# Patient Record
Sex: Female | Born: 1960 | Race: Black or African American | Marital: Single
Health system: Southern US, Community
[De-identification: ages and names within clinical notes are randomized; demographics above are authoritative.]

## PROBLEM LIST (undated history)

## (undated) DIAGNOSIS — Z8619 Personal history of other infectious and parasitic diseases: Secondary | ICD-10-CM

## (undated) DIAGNOSIS — G8929 Other chronic pain: Secondary | ICD-10-CM

## (undated) DIAGNOSIS — M549 Dorsalgia, unspecified: Secondary | ICD-10-CM

## (undated) DIAGNOSIS — N200 Calculus of kidney: Secondary | ICD-10-CM

## (undated) DIAGNOSIS — R350 Frequency of micturition: Secondary | ICD-10-CM

## (undated) DIAGNOSIS — R3915 Urgency of urination: Secondary | ICD-10-CM

## (undated) DIAGNOSIS — I10 Essential (primary) hypertension: Secondary | ICD-10-CM

## (undated) DIAGNOSIS — N201 Calculus of ureter: Secondary | ICD-10-CM

## (undated) DIAGNOSIS — Z87442 Personal history of urinary calculi: Secondary | ICD-10-CM

## (undated) HISTORY — PX: SHOULDER ARTHROSCOPY W/ ROTATOR CUFF REPAIR: SHX2400

---

## 2005-08-25 HISTORY — PX: URETEROLITHOTOMY: SHX71

## 2015-07-11 ENCOUNTER — Emergency Department (HOSPITAL_COMMUNITY): Payer: Self-pay | Admitting: Anesthesiology

## 2015-07-11 ENCOUNTER — Encounter (HOSPITAL_BASED_OUTPATIENT_CLINIC_OR_DEPARTMENT_OTHER): Payer: Self-pay

## 2015-07-11 ENCOUNTER — Emergency Department (HOSPITAL_BASED_OUTPATIENT_CLINIC_OR_DEPARTMENT_OTHER): Payer: Self-pay

## 2015-07-11 ENCOUNTER — Inpatient Hospital Stay (HOSPITAL_BASED_OUTPATIENT_CLINIC_OR_DEPARTMENT_OTHER)
Admission: EM | Admit: 2015-07-11 | Discharge: 2015-07-15 | DRG: 872 | Disposition: A | Discharge status: Home / Self Care | Payer: Self-pay | Attending: Urology | Admitting: Urology

## 2015-07-11 ENCOUNTER — Encounter (HOSPITAL_COMMUNITY)
Admission: EM | Disposition: A | Discharge status: Home / Self Care | Payer: Self-pay | Source: Home / Self Care | Attending: Urology

## 2015-07-11 DIAGNOSIS — B964 Proteus (mirabilis) (morganii) as the cause of diseases classified elsewhere: Secondary | ICD-10-CM | POA: Diagnosis present

## 2015-07-11 DIAGNOSIS — R109 Unspecified abdominal pain: Secondary | ICD-10-CM

## 2015-07-11 DIAGNOSIS — A599 Trichomoniasis, unspecified: Secondary | ICD-10-CM | POA: Diagnosis present

## 2015-07-11 DIAGNOSIS — Z87891 Personal history of nicotine dependence: Secondary | ICD-10-CM

## 2015-07-11 DIAGNOSIS — N201 Calculus of ureter: Secondary | ICD-10-CM

## 2015-07-11 DIAGNOSIS — A419 Sepsis, unspecified organism: Principal | ICD-10-CM | POA: Diagnosis present

## 2015-07-11 DIAGNOSIS — R05 Cough: Secondary | ICD-10-CM | POA: Diagnosis not present

## 2015-07-11 DIAGNOSIS — D72829 Elevated white blood cell count, unspecified: Secondary | ICD-10-CM | POA: Diagnosis present

## 2015-07-11 DIAGNOSIS — Z87442 Personal history of urinary calculi: Secondary | ICD-10-CM

## 2015-07-11 DIAGNOSIS — N132 Hydronephrosis with renal and ureteral calculous obstruction: Secondary | ICD-10-CM | POA: Diagnosis present

## 2015-07-11 DIAGNOSIS — N136 Pyonephrosis: Secondary | ICD-10-CM | POA: Diagnosis present

## 2015-07-11 DIAGNOSIS — N390 Urinary tract infection, site not specified: Secondary | ICD-10-CM | POA: Diagnosis present

## 2015-07-11 DIAGNOSIS — R509 Fever, unspecified: Secondary | ICD-10-CM

## 2015-07-11 HISTORY — PX: CYSTOSCOPY WITH STENT PLACEMENT: SHX5790

## 2015-07-11 LAB — URINALYSIS, ROUTINE W REFLEX MICROSCOPIC
Glucose, UA: NEGATIVE mg/dL
NITRITE: NEGATIVE
Specific Gravity, Urine: 1.035 — ABNORMAL HIGH (ref 1.005–1.030)
pH: 6 (ref 5.0–8.0)

## 2015-07-11 LAB — CBC
HEMATOCRIT: 42.5 % (ref 36.0–46.0)
Hemoglobin: 14.6 g/dL (ref 12.0–15.0)
MCH: 32.4 pg (ref 26.0–34.0)
MCHC: 34.4 g/dL (ref 30.0–36.0)
MCV: 94.4 fL (ref 78.0–100.0)
Platelets: 263 10*3/uL (ref 150–400)
RBC: 4.5 MIL/uL (ref 3.87–5.11)
RDW: 12.4 % (ref 11.5–15.5)
WBC: 17.4 10*3/uL — ABNORMAL HIGH (ref 4.0–10.5)

## 2015-07-11 LAB — COMPREHENSIVE METABOLIC PANEL
ALT: 15 U/L (ref 14–54)
ANION GAP: 13 (ref 5–15)
AST: 28 U/L (ref 15–41)
Albumin: 3.7 g/dL (ref 3.5–5.0)
Alkaline Phosphatase: 86 U/L (ref 38–126)
BILIRUBIN TOTAL: 0.5 mg/dL (ref 0.3–1.2)
BUN: 12 mg/dL (ref 6–20)
CO2: 27 mmol/L (ref 22–32)
Calcium: 8.9 mg/dL (ref 8.9–10.3)
Chloride: 96 mmol/L — ABNORMAL LOW (ref 101–111)
Creatinine, Ser: 0.77 mg/dL (ref 0.44–1.00)
Glucose, Bld: 142 mg/dL — ABNORMAL HIGH (ref 65–99)
POTASSIUM: 3.6 mmol/L (ref 3.5–5.1)
Sodium: 136 mmol/L (ref 135–145)
TOTAL PROTEIN: 7.7 g/dL (ref 6.5–8.1)

## 2015-07-11 LAB — URINE MICROSCOPIC-ADD ON

## 2015-07-11 LAB — I-STAT CG4 LACTIC ACID, ED
Lactic Acid, Venous: 0.45 mmol/L — ABNORMAL LOW (ref 0.5–2.0)
Lactic Acid, Venous: 1.16 mmol/L (ref 0.5–2.0)

## 2015-07-11 SURGERY — CYSTOSCOPY, WITH STENT INSERTION
Anesthesia: General | Laterality: Left

## 2015-07-11 MED ORDER — FENTANYL CITRATE (PF) 100 MCG/2ML IJ SOLN
INTRAMUSCULAR | Status: DC | PRN
  Administered 2015-07-11 (×2): 50 ug via INTRAVENOUS

## 2015-07-11 MED ORDER — OXYCODONE HCL 5 MG PO TABS
5.0000 mg | ORAL_TABLET | ORAL | Status: DC | PRN
  Administered 2015-07-12 – 2015-07-14 (×13): 5 mg via ORAL
  Filled 2015-07-11 (×13): qty 1

## 2015-07-11 MED ORDER — ZOLPIDEM TARTRATE 5 MG PO TABS: 5.0000 mg | ORAL_TABLET | Freq: Every evening | ORAL | Status: DC | PRN

## 2015-07-11 MED ORDER — DOCUSATE SODIUM 100 MG PO CAPS
100.0000 mg | ORAL_CAPSULE | Freq: Two times a day (BID) | ORAL | Status: DC
  Administered 2015-07-12 – 2015-07-15 (×8): 100 mg via ORAL
  Filled 2015-07-11 (×8): qty 1

## 2015-07-11 MED ORDER — OXYBUTYNIN CHLORIDE 5 MG PO TABS
5.0000 mg | ORAL_TABLET | Freq: Three times a day (TID) | ORAL | Status: DC | PRN
  Administered 2015-07-12 – 2015-07-15 (×3): 5 mg via ORAL
  Filled 2015-07-11 (×3): qty 1

## 2015-07-11 MED ORDER — DEXTROSE-NACL 5-0.45 % IV SOLN: INTRAVENOUS | Status: DC

## 2015-07-11 MED ORDER — IBUPROFEN 800 MG PO TABS
800.0000 mg | ORAL_TABLET | Freq: Once | ORAL | Status: AC
  Administered 2015-07-11: 800 mg via ORAL
  Filled 2015-07-11: qty 1

## 2015-07-11 MED ORDER — PROPOFOL 10 MG/ML IV BOLUS
INTRAVENOUS | Status: AC
  Filled 2015-07-11: qty 20

## 2015-07-11 MED ORDER — ONDANSETRON HCL 4 MG/2ML IJ SOLN: 4.0000 mg | INTRAMUSCULAR | Status: DC | PRN

## 2015-07-11 MED ORDER — SUCCINYLCHOLINE CHLORIDE 20 MG/ML IJ SOLN
INTRAMUSCULAR | Status: DC | PRN
  Administered 2015-07-11: 100 mg via INTRAVENOUS

## 2015-07-11 MED ORDER — DEXTROSE-NACL 5-0.45 % IV SOLN
INTRAVENOUS | Status: DC
  Administered 2015-07-12 – 2015-07-14 (×4): via INTRAVENOUS

## 2015-07-11 MED ORDER — ACETAMINOPHEN 325 MG PO TABS
650.0000 mg | ORAL_TABLET | ORAL | Status: DC | PRN
  Administered 2015-07-12 – 2015-07-13 (×6): 650 mg via ORAL
  Filled 2015-07-11 (×7): qty 2

## 2015-07-11 MED ORDER — DEXTROSE 5 % IV SOLN
1.0000 g | Freq: Once | INTRAVENOUS | Status: AC
  Administered 2015-07-11: 1 g via INTRAVENOUS

## 2015-07-11 MED ORDER — PROPOFOL 10 MG/ML IV BOLUS
INTRAVENOUS | Status: DC | PRN
  Administered 2015-07-11: 150 mg via INTRAVENOUS

## 2015-07-11 MED ORDER — HYDROMORPHONE HCL 1 MG/ML IJ SOLN
INTRAMUSCULAR | Status: AC
  Filled 2015-07-11: qty 1

## 2015-07-11 MED ORDER — ENOXAPARIN SODIUM 40 MG/0.4ML ~~LOC~~ SOLN: 40.0000 mg | SUBCUTANEOUS | Status: DC

## 2015-07-11 MED ORDER — ACETAMINOPHEN 325 MG PO TABS: 650.0000 mg | ORAL_TABLET | ORAL | Status: DC | PRN

## 2015-07-11 MED ORDER — OXYBUTYNIN CHLORIDE 5 MG PO TABS: 5.0000 mg | ORAL_TABLET | Freq: Three times a day (TID) | ORAL | Status: DC | PRN

## 2015-07-11 MED ORDER — CEFTRIAXONE SODIUM 1 G IJ SOLR
INTRAMUSCULAR | Status: AC
  Filled 2015-07-11: qty 10

## 2015-07-11 MED ORDER — DEXTROSE 5 % IV SOLN: 1.0000 g | INTRAVENOUS | Status: DC

## 2015-07-11 MED ORDER — LIDOCAINE HCL (CARDIAC) 20 MG/ML IV SOLN
INTRAVENOUS | Status: AC
  Filled 2015-07-11: qty 5

## 2015-07-11 MED ORDER — HYDROMORPHONE HCL 1 MG/ML IJ SOLN
1.0000 mg | Freq: Once | INTRAMUSCULAR | Status: AC
  Administered 2015-07-11: 1 mg via INTRAVENOUS

## 2015-07-11 MED ORDER — ROCURONIUM BROMIDE 100 MG/10ML IV SOLN
INTRAVENOUS | Status: DC | PRN
  Administered 2015-07-11: 10 mg via INTRAVENOUS

## 2015-07-11 MED ORDER — METOCLOPRAMIDE HCL 5 MG/ML IJ SOLN
INTRAMUSCULAR | Status: AC
  Filled 2015-07-11: qty 2

## 2015-07-11 MED ORDER — OXYCODONE HCL 5 MG PO TABS: 5.0000 mg | ORAL_TABLET | ORAL | Status: DC | PRN

## 2015-07-11 MED ORDER — FENTANYL CITRATE (PF) 100 MCG/2ML IJ SOLN
INTRAMUSCULAR | Status: AC
  Filled 2015-07-11: qty 2

## 2015-07-11 MED ORDER — FENTANYL CITRATE (PF) 100 MCG/2ML IJ SOLN
25.0000 ug | INTRAMUSCULAR | Status: DC | PRN
  Administered 2015-07-11 (×3): 50 ug via INTRAVENOUS

## 2015-07-11 MED ORDER — NEOSTIGMINE METHYLSULFATE 10 MG/10ML IV SOLN
INTRAVENOUS | Status: DC | PRN
Start: 2015-07-11 — End: 2015-07-11
  Administered 2015-07-11: 3 mg via INTRAVENOUS

## 2015-07-11 MED ORDER — IOHEXOL 300 MG/ML  SOLN
INTRAMUSCULAR | Status: DC | PRN
  Administered 2015-07-11: 10 mL

## 2015-07-11 MED ORDER — MORPHINE SULFATE (PF) 4 MG/ML IV SOLN
4.0000 mg | Freq: Once | INTRAVENOUS | Status: AC
  Administered 2015-07-11: 4 mg via INTRAVENOUS
  Filled 2015-07-11: qty 1

## 2015-07-11 MED ORDER — INFLUENZA VAC SPLIT QUAD 0.5 ML IM SUSY
0.5000 mL | PREFILLED_SYRINGE | INTRAMUSCULAR | Status: DC
  Filled 2015-07-11 (×2): qty 0.5

## 2015-07-11 MED ORDER — PROMETHAZINE HCL 25 MG/ML IJ SOLN: 6.2500 mg | INTRAMUSCULAR | Status: DC | PRN

## 2015-07-11 MED ORDER — LACTATED RINGERS IV SOLN
INTRAVENOUS | Status: DC | PRN
  Administered 2015-07-11: 22:00:00 via INTRAVENOUS

## 2015-07-11 MED ORDER — PNEUMOCOCCAL VAC POLYVALENT 25 MCG/0.5ML IJ INJ
0.5000 mL | INJECTION | INTRAMUSCULAR | Status: DC
  Filled 2015-07-11 (×2): qty 0.5

## 2015-07-11 MED ORDER — METOCLOPRAMIDE HCL 5 MG/ML IJ SOLN
INTRAMUSCULAR | Status: DC | PRN
  Administered 2015-07-11: 10 mg via INTRAVENOUS

## 2015-07-11 MED ORDER — ONDANSETRON HCL 4 MG/2ML IJ SOLN
INTRAMUSCULAR | Status: AC
  Filled 2015-07-11: qty 2

## 2015-07-11 MED ORDER — METRONIDAZOLE 500 MG PO TABS
2000.0000 mg | ORAL_TABLET | Freq: Once | ORAL | Status: AC
  Administered 2015-07-11: 2000 mg via ORAL
  Filled 2015-07-11: qty 4

## 2015-07-11 MED ORDER — GLYCOPYRROLATE 0.2 MG/ML IJ SOLN
INTRAMUSCULAR | Status: AC
  Filled 2015-07-11: qty 2

## 2015-07-11 MED ORDER — GLYCOPYRROLATE 0.2 MG/ML IJ SOLN
INTRAMUSCULAR | Status: DC | PRN
  Administered 2015-07-11: .4 mg via INTRAVENOUS

## 2015-07-11 MED ORDER — LIDOCAINE HCL (CARDIAC) 20 MG/ML IV SOLN
INTRAVENOUS | Status: DC | PRN
  Administered 2015-07-11: 50 mg via INTRAVENOUS

## 2015-07-11 MED ORDER — SODIUM CHLORIDE 0.9 % IR SOLN
Status: DC | PRN
  Administered 2015-07-11: 3000 mL via INTRAVESICAL

## 2015-07-11 MED ORDER — ONDANSETRON HCL 4 MG/2ML IJ SOLN
4.0000 mg | Freq: Once | INTRAMUSCULAR | Status: AC
  Administered 2015-07-11: 4 mg via INTRAVENOUS
  Filled 2015-07-11: qty 2

## 2015-07-11 MED ORDER — ONDANSETRON HCL 4 MG/2ML IJ SOLN
INTRAMUSCULAR | Status: DC | PRN
  Administered 2015-07-11: 4 mg via INTRAVENOUS

## 2015-07-11 MED ORDER — DEXTROSE 5 % IV SOLN
1.0000 g | INTRAVENOUS | Status: DC
  Administered 2015-07-12: 1 g via INTRAVENOUS
  Filled 2015-07-11 (×2): qty 10

## 2015-07-11 MED ORDER — ENOXAPARIN SODIUM 40 MG/0.4ML ~~LOC~~ SOLN
40.0000 mg | Freq: Every day | SUBCUTANEOUS | Status: DC
  Administered 2015-07-12 – 2015-07-14 (×4): 40 mg via SUBCUTANEOUS
  Filled 2015-07-11 (×4): qty 0.4

## 2015-07-11 SURGICAL SUPPLY — 15 items
BAG URO CATCHER STRL LF (DRAPE) ×3 IMPLANT
CATH URET 5FR 28IN CONE TIP (BALLOONS) ×2
CATH URET 5FR 70CM CONE TIP (BALLOONS) ×1 IMPLANT
CLOTH BEACON ORANGE TIMEOUT ST (SAFETY) ×3 IMPLANT
GLOVE BIOGEL M 8.0 STRL (GLOVE) ×3 IMPLANT
GOWN STRL REUS W/ TWL XL LVL3 (GOWN DISPOSABLE) ×1 IMPLANT
GOWN STRL REUS W/TWL XL LVL3 (GOWN DISPOSABLE) ×5 IMPLANT
GUIDEWIRE STR DUAL SENSOR (WIRE) ×3 IMPLANT
MANIFOLD NEPTUNE II (INSTRUMENTS) ×3 IMPLANT
PACK CYSTO (CUSTOM PROCEDURE TRAY) ×3 IMPLANT
SHEATH ACCESS URETERAL 38CM (SHEATH) ×3 IMPLANT
STENT URET 6FRX24 CONTOUR (STENTS) ×3 IMPLANT
TUBING CONNECTING 10 (TUBING) ×2 IMPLANT
TUBING CONNECTING 10' (TUBING) ×1
WIRE COONS/BENSON .038X145CM (WIRE) IMPLANT

## 2015-07-11 NOTE — H&P (Signed)
H&P  Chief Complaint: Kidney stone  History of Present Illness: Terri Reyes is a 54 y.o. year old female who is admitted to [facility] for management of a probable infected, very painful left distal ureteral stone. The patient has been having intermittent left flank pain for 2-3 weeks. Over the past 3-4 days, this is been getting severe. It is now associated with significant pain, nausea, vomiting, fever and chills. In the [place], the patient had a fever 102. She had leukocytosis and a CT scan revealed a 9 mm left UVJ stone. I was contacted for further management. I have recommended that we transfer the patient here and have her undergo urgent stenting.  On evaluation here, the patient has significant pain. She still has nausea but has not had recent vomiting.  Past Medical History  Diagnosis Date  . Kidney stones     Past Surgical History  Procedure Laterality Date  . Shoulder surgery    . Kidney stone surgery      Home Medications:   (Not in a hospital admission)  Allergies: No Known Allergies  No family history on file.  Social History:  reports that she has been smoking.  She does not have any smokeless tobacco history on file. She reports that she drinks alcohol. She reports that she does not use illicit drugs.  ROS: A complete review of systems was performed.  All systems are negative except for pertinent findings as noted.  Physical Exam:  Vital signs in last 24 hours: Temp:  [98.5 F (36.9 C)-101.8 F (38.8 C)] 98.7 F (37.1 C) (11/16 2037) Pulse Rate:  [78-106] 89 (11/16 2037) Resp:  [16-18] 18 (11/16 2037) BP: (112-137)/(70-96) 122/86 mmHg (11/16 2037) SpO2:  [94 %-98 %] 98 % (11/16 2037) Weight:  [70.308 kg (155 lb)] 70.308 kg (155 lb) (11/16 1437) General:  Alert and oriented, in moderate distress HEENT: Normocephalic, atraumatic Neck: No JVD or lymphadenopathy Cardiovascular: Regular rate and rhythm Lungs: Clear  bilaterally Abdomen: Soft, left lower quadrant and left CVA tenderness. No rebound or guarding. Extremities: No edema Neurologic: Grossly intact  Laboratory Data:  Results for orders placed or performed during the hospital encounter of 07/11/15 (from the past 24 hour(s))  Urinalysis, Routine w reflex microscopic (not at [facility])     Status: Abnormal   Collection Time: 07/11/15  2:30 PM  Result Value Ref Range   Color, Urine AMBER (A) YELLOW   APPearance TURBID (A) CLEAR   Specific Gravity, Urine 1.035 (H) 1.005 - 1.030   pH 6.0 5.0 - 8.0   Glucose, UA NEGATIVE NEGATIVE mg/dL   Hgb urine dipstick LARGE (A) NEGATIVE   Bilirubin Urine SMALL (A) NEGATIVE   Ketones, ur >80 (A) NEGATIVE mg/dL   Protein, ur >161 (A) NEGATIVE mg/dL   Nitrite NEGATIVE NEGATIVE   Leukocytes, UA MODERATE (A) NEGATIVE  Urine microscopic-add on     Status: Abnormal   Collection Time: 07/11/15  2:30 PM  Result Value Ref Range   Squamous Epithelial / LPF 6-30 (A) NONE SEEN   WBC, UA 6-30 0 - 5 WBC/hpf   RBC / HPF 6-30 0 - 5 RBC/hpf   Bacteria, UA FEW (A) NONE SEEN   Urine-Other MUCOUS PRESENT   CBC     Status: Abnormal   Collection Time: 07/11/15  3:40 PM  Result Value Ref Range   WBC 17.4 (H) 4.0 - 10.5 K/uL   RBC 4.50 3.87 - 5.11 MIL/uL   Hemoglobin 14.6  12.0 - 15.0 g/dL   HCT 16.1 09.6 - 04.5 %   MCV 94.4 78.0 - 100.0 fL   MCH 32.4 26.0 - 34.0 pg   MCHC 34.4 30.0 - 36.0 g/dL   RDW 40.9 81.1 - 91.4 %   Platelets 263 150 - 400 K/uL  Comprehensive metabolic panel     Status: Abnormal   Collection Time: 07/11/15  3:40 PM  Result Value Ref Range   Sodium 136 135 - 145 mmol/L   Potassium 3.6 3.5 - 5.1 mmol/L   Chloride 96 (L) 101 - 111 mmol/L   CO2 27 22 - 32 mmol/L   Glucose, Bld 142 (H) 65 - 99 mg/dL   BUN 12 6 - 20 mg/dL   Creatinine, Ser 7.82 0.44 - 1.00 mg/dL   Calcium 8.9 8.9 - 95.6 mg/dL   Total Protein 7.7 6.5 - 8.1 g/dL   Albumin 3.7 3.5 - 5.0 g/dL   AST 28 15 - 41 U/L   ALT 15 14 - 54 U/L    Alkaline Phosphatase 86 38 - 126 U/L   Total Bilirubin 0.5 0.3 - 1.2 mg/dL   GFR calc non Af Amer >60 >60 mL/min   GFR calc Af Amer >60 >60 mL/min   Anion gap 13 5 - 15  I-Stat CG4 Lactic Acid, ED     Status: Abnormal   Collection Time: 07/11/15  5:20 PM  Result Value Ref Range   Lactic Acid, Venous 0.45 (L) 0.5 - 2.0 mmol/L  I-Stat CG4 Lactic Acid, ED     Status: None   Collection Time: 07/11/15  9:00 PM  Result Value Ref Range   Lactic Acid, Venous 1.16 0.5 - 2.0 mmol/L   No results found for this or any previous visit (from the past 240 hour(s)). Creatinine:  Recent Labs  07/11/15 1540  CREATININE 0.77    Radiologic Imaging: Dg Chest 2 View  07/11/2015  CLINICAL DATA:  Cough since Saturday, abdominal pain since last night, history smoking, kidney stones EXAM: CHEST  2 VIEW COMPARISON:  None FINDINGS: Normal heart size, mediastinal contours, and pulmonary vascularity. Mild peribronchial thickening. No pulmonary infiltrate, pleural effusion, or pneumothorax. Bones unremarkable. IMPRESSION: Bronchitic changes without infiltrate. Electronically Signed   By: Ulyses Southward M.D.   On: 07/11/2015 15:00   Ct Renal Stone Study  07/11/2015  CLINICAL DATA:  Acute left flank pain. EXAM: CT ABDOMEN AND PELVIS WITHOUT CONTRAST TECHNIQUE: Multidetector CT imaging of the abdomen and pelvis was performed following the standard protocol without IV contrast. COMPARISON:  None. 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 Visualized lung bases are unremarkable. No gallstones are noted. No focal abnormality is noted in the liver, spleen or pancreas on these unenhanced images. Adrenal glands appear normal. Small nonobstructive calculus is noted in lower pole collecting system of right kidney. Moderate to severe left hydroureteronephrosis is noted with perinephric stranding secondary to 9 x 5 mm calculus at left ureterovesical junction. The appendix appears normal. There  is no evidence of abdominal aortic aneurysm. There is no evidence of bowel obstruction. Uterus and ovaries are unremarkable. No significant adenopathy is noted. IMPRESSION: Small nonobstructive right renal calculus. Moderate to severe left hydroureteronephrosis with perinephric stranding is noted secondary to 9 x 5 mm left ureterovesical junction calculus. Electronically Signed   By: Lupita Raider, M.D.   On: 07/11/2015 16:19   I have reviewed the above labs and CT images. Impression/Assessment:  Left distal ureteral stone, 5  x 9 mm with significant hydronephrosis and probable pyonephrosis  Plan:  Cystoscopy, left retrograde and double-J stent placement. She will be admitted for antibiotic management postoperatively. I discussed the procedure as well as risks and complications with the patient she understands and desires to proceed.  Chelsea AusDAHLSTEDT, Azeez Dunker M 07/11/2015, 9:23 PM  Bertram MillardStephen M. Lillyen Schow MD

## 2015-07-11 NOTE — ED Notes (Signed)
Spoke with Toniann FailWendy in the OR. Patient is ready for surgery and transportation is on their way.

## 2015-07-11 NOTE — ED Provider Notes (Signed)
CSN: [id number]     Arrival date & time 07/11/15  1424 History   First MD Initiated Contact with Patient 07/11/15 1531     Chief Complaint  Patient presents with  . Cough  . Abdominal Pain     (Consider location/radiation/quality/duration/timing/severity/associated sxs/prior Treatment) Patient is a 54 y.o. female presenting with cough and abdominal pain. The history is provided by the patient and medical records.  Cough Abdominal Pain Associated symptoms: cough and dysuria     54 year old female with history of kidney stones, presenting to the ED for cough and abdominal pain. Patient states since Saturday afternoon she has been having a nonproductive cough, fever, and chills. She denies any recent sick contacts. She has been taking some Mucinex which seems to be breaking up her congestion, but she continues coughing. She denies any chest pain or shortness of breath. She states yesterday she began having some left lower abdominal pain with radiation to her low back. She reports urinary frequency and dysuria. She denies any hematuria. States prior kidney stones had similar symptoms, last episode was approximately 10 years ago. States she used to see urologist while living in [place]-- has had stenting, lithotripsy, and other procedures in the past.  Patient with low-grade fever and mild tachycardia on arrival.  Past Medical History  Diagnosis Date  . Kidney stones    Past Surgical History  Procedure Laterality Date  . Shoulder surgery    . Kidney stone surgery     No family history on file. Social History  Substance Use Topics  . Smoking status: Current Every Day Smoker  . Smokeless tobacco: None  . Alcohol Use: Yes     Comment: weekly   OB History    No data available     Review of Systems  Respiratory: Positive for cough.   Gastrointestinal: Positive for abdominal pain.  Genitourinary: Positive for dysuria, frequency and flank pain.  All other systems reviewed and are  negative.     Allergies  Review of patient's allergies indicates no known allergies.  Home Medications   Prior to Admission medications   Not on File   BP 137/96 mmHg  Pulse 106  Temp(Src) 101.8 F (38.8 C) (Oral)  Resp 18  Ht  (1.702 m)  Wt 155 lb (70.308 kg)  BMI 24.27 kg/m2   Physical Exam  Constitutional: She is oriented to person, place, and time. She appears well-developed and well-nourished.  Non-toxic appearance. She does not appear ill. No distress.  HENT:  Head: Normocephalic and atraumatic.  Right Ear: Tympanic membrane and ear canal normal.  Left Ear: Tympanic membrane and ear canal normal.  Nose: Nose normal.  Mouth/Throat: Uvula is midline, oropharynx is clear and moist and mucous membranes are normal. No oropharyngeal exudate, posterior oropharyngeal edema, posterior oropharyngeal erythema or tonsillar abscesses.  Eyes: Conjunctivae and EOM are normal. Pupils are equal, round, and reactive to light.  Neck: Normal range of motion. Neck supple.  Cardiovascular: Normal rate, regular rhythm and normal heart sounds.   Pulmonary/Chest: Effort normal and breath sounds normal. No respiratory distress. She has no wheezes.  Dry cough noted on exam  Abdominal: Soft. Bowel sounds are normal. There is no tenderness. There is CVA tenderness (left, mild). There is no guarding.  Endorses pain and left lower quadrant with radiation to left flank, mild left CVA tenderness  Musculoskeletal: Normal range of motion. She exhibits no edema.  Neurological: She is alert and oriented to person, place, and time.  Skin:  Skin is warm and dry. She is not diaphoretic.  Psychiatric: She has a normal mood and affect.  Nursing note and vitals reviewed.   ED Course  Procedures (including critical care time) Labs Review Labs Reviewed  URINALYSIS, ROUTINE W REFLEX MICROSCOPIC (NOT AT [facility]) - Abnormal; Notable for the following:    Color, Urine AMBER (*)    APPearance TURBID (*)     Specific Gravity, Urine 1.035 (*)    Hgb urine dipstick LARGE (*)    Bilirubin Urine SMALL (*)    Ketones, ur >80 (*)    Protein, ur >300 (*)    Leukocytes, UA MODERATE (*)    All other components within normal limits  URINE MICROSCOPIC-ADD ON - Abnormal; Notable for the following:    Squamous Epithelial / LPF 6-30 (*)    Bacteria, UA FEW (*)    All other components within normal limits  CBC - Abnormal; Notable for the following:    WBC 17.4 (*)    All other components within normal limits  COMPREHENSIVE METABOLIC PANEL    Imaging Review Dg Chest 2 View  07/11/2015  CLINICAL DATA:  Cough since Saturday, abdominal pain since last night, history smoking, kidney stones EXAM: CHEST  2 VIEW COMPARISON:  None FINDINGS: Normal heart size, mediastinal contours, and pulmonary vascularity. Mild peribronchial thickening. No pulmonary infiltrate, pleural effusion, or pneumothorax. Bones unremarkable. IMPRESSION: Bronchitic changes without infiltrate. Electronically Signed   By: Ulyses Southward M.D.   On: 07/11/2015 15:00   Ct Renal Stone Study  07/11/2015  CLINICAL DATA:  Acute left flank pain. EXAM: CT ABDOMEN AND PELVIS WITHOUT CONTRAST TECHNIQUE: Multidetector CT imaging of the abdomen and pelvis was performed following the standard protocol without IV contrast. COMPARISON:  None. 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 Visualized lung bases are unremarkable. No gallstones are noted. No focal abnormality is noted in the liver, spleen or pancreas on these unenhanced images. Adrenal glands appear normal. Small nonobstructive calculus is noted in lower pole collecting system of right kidney. Moderate to severe left hydroureteronephrosis is noted with perinephric stranding secondary to 9 x 5 mm calculus at left ureterovesical junction. The appendix appears normal. There is no evidence of abdominal aortic aneurysm. There is no evidence of bowel obstruction. Uterus and  ovaries are unremarkable. No significant adenopathy is noted. IMPRESSION: Small nonobstructive right renal calculus. Moderate to severe left hydroureteronephrosis with perinephric stranding is noted secondary to 9 x 5 mm left ureterovesical junction calculus. Electronically Signed   By: Lupita Raider, M.D.   On: 07/11/2015 16:19   I have personally reviewed and evaluated these images and lab results as part of my medical decision-making.   EKG Interpretation None      MDM   Final diagnoses:  Left flank pain   54 year old female here with difficulty urinating and left lower abdominal pain and flank pain. Patient with low-grade fever and mild tachycardia on arrival. She is nontoxic in appearance. She does have some left CVA tenderness. UA appears infectious, culture pending. Labwork with significant leukocytosis, lactic acid within normal limits.  CT renal study was obtained revealing moderate to severe left hydroureteronephrosis secondary to 9 x 5 mm calculus. This coupled with patient's infected urine, leukocytosis, and fever raises suspicion for infected stone. Will speak with urology. Dose of Rocephin given. Patient was also given 2 g Flagyl as trichomonas was noted in her urine. She denies current vaginal symptoms or discharge.  6:55 PM Spoke  with Dr. Retta Dionesahlstedt-- [facility], patient likely will need stent.  ER MD Rubin PayorPickering made aware of transfer.  Fever and tachycardia resolved at this time, BP stable.  Garlon HatchetLisa M Seraphim Affinito, PA-C 07/11/15 2015  Lyndal Pulleyaniel Knott, MD 07/12/15 1250

## 2015-07-11 NOTE — ED Notes (Signed)
PA at bedside.

## 2015-07-11 NOTE — ED Notes (Signed)
Pt transported to surgery

## 2015-07-11 NOTE — ED Notes (Signed)
pa at bedside. 

## 2015-07-11 NOTE — Transfer of Care (Signed)
Immediate Anesthesia Transfer of Care Note  Patient: Terri MaroonLisa Gertsch  Procedure(s) Performed: Procedure(s): CYSTOSCOPY WITH STENT PLACEMENT (Left)  Patient Location: PACU  Anesthesia Type:General  Level of Consciousness: awake, alert , oriented and patient cooperative  Airway & Oxygen Therapy: Patient Spontanous Breathing and Patient connected to face mask oxygen  Post-op Assessment: Report given to RN, Post -op Vital signs reviewed and stable and Patient moving all extremities  Post vital signs: Reviewed and stable  Last Vitals:  Filed Vitals:   07/11/15 2100  BP: 119/74  Pulse: 81  Temp:   Resp: 18    Complications: No apparent anesthesia complications

## 2015-07-11 NOTE — ED Notes (Signed)
INFORMED consent was signed by patient, witnessed Terrilyn Saver(Mikita Lesmeister RN) and physician.

## 2015-07-11 NOTE — ED Notes (Signed)
C/o nonprod cough, chills since Saturday-abd pain since yesterday-denies n/v/d

## 2015-07-11 NOTE — Anesthesia Preprocedure Evaluation (Addendum)
Anesthesia Evaluation  Patient identified by MRN, date of birth, ID band Patient awake    Reviewed: Allergy & Precautions, NPO status , Patient's Chart, lab work & pertinent test results  Airway Mallampati: II  TM Distance: >3 FB Neck ROM: Full    Dental no notable dental hx.    Pulmonary Current Smoker,    Pulmonary exam normal breath sounds clear to auscultation       Cardiovascular negative cardio ROS Normal cardiovascular exam Rhythm:Regular Rate:Normal     Neuro/Psych negative neurological ROS  negative psych ROS   GI/Hepatic negative GI ROS, Neg liver ROS,   Endo/Other  negative endocrine ROS  Renal/GU Renal disease  negative genitourinary   Musculoskeletal negative musculoskeletal ROS (+)   Abdominal   Peds negative pediatric ROS (+)  Hematology negative hematology ROS (+)   Anesthesia Other Findings   Reproductive/Obstetrics negative OB ROS                            Anesthesia Physical Anesthesia Plan  ASA: II and emergent  Anesthesia Plan: General   Post-op Pain Management:    Induction: Intravenous  Airway Management Planned: Oral ETT  Additional Equipment:   Intra-op Plan:   Post-operative Plan: Extubation in OR  Informed Consent: I have reviewed the patients History and Physical, chart, labs and discussed the procedure including the risks, benefits and alternatives for the proposed anesthesia with the patient or authorized representative who has indicated his/her understanding and acceptance.   Dental advisory given  Plan Discussed with: CRNA  Anesthesia Plan Comments:         Anesthesia Quick Evaluation

## 2015-07-11 NOTE — Anesthesia Procedure Notes (Signed)
Procedure Name: Intubation Date/Time: 07/11/2015 10:14 PM Performed by: Jarvis NewcomerARMISTEAD, Brelee Renk A Pre-anesthesia Checklist: Patient identified, Emergency Drugs available, Suction available, Patient being monitored and Timeout performed Patient Re-evaluated:Patient Re-evaluated prior to inductionOxygen Delivery Method: Circle system utilized Preoxygenation: Pre-oxygenation with 100% oxygen Intubation Type: IV induction, Cricoid Pressure applied and Rapid sequence Laryngoscope Size: Mac and 4 Grade View: Grade I Tube type: Oral Tube size: 7.5 mm Number of attempts: 1 Airway Equipment and Method: Stylet Placement Confirmation: ETT inserted through vocal cords under direct vision,  positive ETCO2 and breath sounds checked- equal and bilateral Secured at: 20 cm Tube secured with: Tape Dental Injury: Teeth and Oropharynx as per pre-operative assessment  Comments: Intubation by Dr. Council Mechanicenenny

## 2015-07-11 NOTE — Op Note (Signed)
Preoperative diagnosis: Infected, obstructing left distal ureteral stone  Postoperative diagnosis: Same   Procedure: Cystoscopy, left retrograde ureteropyelogram, interpretive fluoroscopy, left double-J stent placement (24 cm x 6 [place]French contour without string)    Surgeon: Bertram MillardStephen M. Katie Moch, M.D.   Anesthesia: Gen.   Complications: None  Specimen(s): None  Drain(s): See above stent  Indications: 54 year old female who [facility] with left flank pain, fever, chills, infected urine and CT evidence of a 5 x 9 mm left distal ureteral stone with hydronephrosis. She presents at this time, having [facility] stent [facility], to be followed by eventual ureteroscopic management of her stone once antibiotic management has been completed.    Technique and findings: The patient was properly identified in the holding area. Her surgical site had been properly marked. She was taken the operating room where general anesthetic was administered. She was placed in the dorsolithotomy position. Genitalia and perineum were prepped and draped. Proper timeout was performed.  A 23 French panendoscope was advanced into her bladder. The bladder was inspected. Circumferential inspection was performed, revealing normal bladder urothelium, no tumors, foreign bodies or trabeculations. Ureteral orifices were normal in configuration and location. The left ureteral orifice was cannulated with a 6 [place] open-ended catheter. Gentle retrograde ureteropyelogram was performed using Omnipaque. This revealed a significant filling defect in the distal ureter, just proximal to the ureteral orifice. There was significant proximal hydroureteronephrosis as well as pyelocaliectasis. No further filling defects were seen, however.  At this point, a sensor-tip guidewire was advanced through the open-ended catheter, up into the left ureter with adequate curl seen in the pelvis  and upper pole. Over top of the guidewire, once the open-ended catheter was removed, a 24 cm x 6 French contour double-J stent was placed. Good proximal and distal curls were seen using fluoroscopic and cystoscopic guidance once he guidewire was removed. The bladder was then drained. The scope was removed and the procedure terminated.   The patient tolerated procedure well. She was awakened and then taken to the PACU in stable condition.

## 2015-07-11 NOTE — ED Notes (Signed)
Received report from [facility]([place]). Patients vital signs are BP: 117/83, HR:85, RR:16, O2 93% RA, Temp: 98.0 oral. Pain 4/10.

## 2015-07-11 NOTE — ED Notes (Signed)
Patient transported to CT 

## 2015-07-12 ENCOUNTER — Encounter (HOSPITAL_COMMUNITY): Payer: Self-pay | Admitting: Urology

## 2015-07-12 LAB — URINE CULTURE

## 2015-07-12 LAB — CBC
HCT: 37.4 % (ref 36.0–46.0)
HEMOGLOBIN: 12.8 g/dL (ref 12.0–15.0)
MCH: 33.2 pg (ref 26.0–34.0)
MCHC: 34.2 g/dL (ref 30.0–36.0)
MCV: 96.9 fL (ref 78.0–100.0)
Platelets: 240 10*3/uL (ref 150–400)
RBC: 3.86 MIL/uL — AB (ref 3.87–5.11)
RDW: 13.1 % (ref 11.5–15.5)
WBC: 14.5 10*3/uL — AB (ref 4.0–10.5)

## 2015-07-12 LAB — CREATININE, SERUM: CREATININE: 0.8 mg/dL (ref 0.44–1.00)

## 2015-07-12 MED ORDER — SODIUM CHLORIDE 0.9 % IJ SOLN: 3.0000 mL | INTRAMUSCULAR | Status: DC | PRN

## 2015-07-12 MED ORDER — SODIUM CHLORIDE 0.9 % IV SOLN: 250.0000 mL | INTRAVENOUS | Status: DC | PRN

## 2015-07-12 MED ORDER — PHENOL 1.4 % MT LIQD
1.0000 | OROMUCOSAL | Status: DC | PRN
  Administered 2015-07-12: 1 via OROMUCOSAL
  Filled 2015-07-12: qty 177

## 2015-07-12 MED ORDER — MENTHOL 3 MG MT LOZG
1.0000 | LOZENGE | OROMUCOSAL | Status: DC | PRN
  Administered 2015-07-12: 3 mg via ORAL
  Filled 2015-07-12: qty 9

## 2015-07-12 MED ORDER — SODIUM CHLORIDE 0.9 % IJ SOLN: 3.0000 mL | Freq: Two times a day (BID) | INTRAMUSCULAR | Status: DC

## 2015-07-12 NOTE — Anesthesia Postprocedure Evaluation (Signed)
  Anesthesia Post-op Note  Patient: Terri Reyes  Procedure(s) Performed: Procedure(s) (LRB): CYSTOSCOPY WITH STENT PLACEMENT LEFT RETROGRADE (Left)  Patient Location: PACU  Anesthesia Type: General  Level of Consciousness: awake and alert   Airway and Oxygen Therapy: Patient Spontanous Breathing  Post-op Pain: mild  Post-op Assessment: Post-op Vital signs reviewed, Patient's Cardiovascular Status Stable, Respiratory Function Stable, Patent Airway and No signs of Nausea or vomiting  Last Vitals:  Filed Vitals:   07/12/15 0433  BP: 119/81  Pulse: 89  Temp: 36.9 C  Resp: 16    Post-op Vital Signs: stable   Complications: No apparent anesthesia complications

## 2015-07-12 NOTE — Progress Notes (Signed)
PHARMACY NOTE -  antibiotic renal dose adjustment   Pharmacy has been assisting with dosing of Ceftriaxone for UTI. Dosage remains stable at Ceftriaxone 1g IV q24h and need for further dosage adjustment appears unlikely at present.    Pharmacy will sign off at this time.  Please reconsult if a change in clinical status warrants re-evaluation of dosage.  Lynann Beaverhristine Raquel Racey PharmD, BCPS Pager [phone] 07/12/2015 7:45 AM

## 2015-07-12 NOTE — Progress Notes (Signed)
1 Day Post-Op Subjective: Patient reports that she's feeling better. She's had some chills, but she is having no pain, and has no stent discomfort  Objective: Vital signs in last 24 hours: Temp:  [98.5 F (36.9 C)-101.8 F (38.8 C)] 98.5 F (36.9 C) (11/17 0433) Pulse Rate:  [78-106] 89 (11/17 0433) Resp:  [15-21] 16 (11/17 0433) BP: (103-137)/(70-96) 119/81 mmHg (11/17 0433) SpO2:  [94 %-100 %] 100 % (11/17 0433) Weight:  [70.308 kg (155 lb)] 70.308 kg (155 lb) (11/16 1437)  Intake/Output from previous day: 11/16 0701 - 11/17 0700 In: 1017.5 [I.V.:1017.5] Out: 300 [Urine:300] Intake/Output this shift: Total I/O In: 1017.5 [I.V.:1017.5] Out: 300 [Urine:300]  Physical Exam:  Constitutional: Vital signs reviewed. WD WN in NAD   Eyes: PERRL, No scleral icterus.   Cardiovascular: RRR Pulmonary/Chest: Normal effort Genitourinary: Extremities: No cyanosis or edema   Lab Results:  Recent Labs  07/11/15 1540 07/12/15 0035  HGB 14.6 12.8  HCT 42.5 37.4   BMET  Recent Labs  07/11/15 1540 07/12/15 0035  NA 136  --   K 3.6  --   CL 96*  --   CO2 27  --   GLUCOSE 142*  --   BUN 12  --   CREATININE 0.77 0.80  CALCIUM 8.9  --    No results for input(s): LABPT, INR in the last 72 hours. No results for input(s): LABURIN in the last 72 hours. No results found for this or any previous visit.  Studies/Results: Dg Chest 2 View  07/11/2015  CLINICAL DATA:  Cough since Saturday, abdominal pain since last night, history smoking, kidney stones EXAM: CHEST  2 VIEW COMPARISON:  None FINDINGS: Normal heart size, mediastinal contours, and pulmonary vascularity. Mild peribronchial thickening. No pulmonary infiltrate, pleural effusion, or pneumothorax. Bones unremarkable. IMPRESSION: Bronchitic changes without infiltrate. Electronically Signed   By: Ulyses SouthwardMark  Boles M.D.   On: 07/11/2015 15:00   Ct Renal Stone Study  07/11/2015  CLINICAL DATA:  Acute left flank pain. EXAM: CT ABDOMEN  AND PELVIS WITHOUT CONTRAST TECHNIQUE: Multidetector CT imaging of the abdomen and pelvis was performed following the standard protocol without IV contrast. COMPARISON:  None. 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 Visualized lung bases are unremarkable. No gallstones are noted. No focal abnormality is noted in the liver, spleen or pancreas on these unenhanced images. Adrenal glands appear normal. Small nonobstructive calculus is noted in lower pole collecting system of right kidney. Moderate to severe left hydroureteronephrosis is noted with perinephric stranding secondary to 9 x 5 mm calculus at left ureterovesical junction. The appendix appears normal. There is no evidence of abdominal aortic aneurysm. There is no evidence of bowel obstruction. Uterus and ovaries are unremarkable. No significant adenopathy is noted. IMPRESSION: Small nonobstructive right renal calculus. Moderate to severe left hydroureteronephrosis with perinephric stranding is noted secondary to 9 x 5 mm left ureterovesical junction calculus. Electronically Signed   By: Lupita RaiderJames  Green Jr, M.D.   On: 07/11/2015 16:19    Assessment/Plan:   Postoperative day #1 from placement of left double-J stent for probable pyonephrosis. She seems to be doing well. I will saline lock her IV. I like to leave her in at least 24 more hours for antibiotic management.   LOS: 1 day   Marcine MatarDAHLSTEDT, Dorthia Tout M 07/12/2015, 7:00 AM

## 2015-07-13 MED ORDER — SULFAMETHOXAZOLE-TRIMETHOPRIM 800-160 MG PO TABS
1.0000 | ORAL_TABLET | Freq: Two times a day (BID) | ORAL | Status: DC
Start: 2015-07-13 — End: 2015-07-15

## 2015-07-13 MED ORDER — OXYCODONE HCL 5 MG PO TABS: 5.0000 mg | ORAL_TABLET | ORAL | Status: DC | PRN

## 2015-07-13 MED ORDER — PIPERACILLIN-TAZOBACTAM 3.375 G IVPB
3.3750 g | Freq: Three times a day (TID) | INTRAVENOUS | Status: DC
  Administered 2015-07-13 – 2015-07-15 (×7): 3.375 g via INTRAVENOUS
  Filled 2015-07-13 (×7): qty 50

## 2015-07-13 MED ORDER — OXYBUTYNIN CHLORIDE 5 MG PO TABS: 5.0000 mg | ORAL_TABLET | Freq: Three times a day (TID) | ORAL | Status: DC | PRN

## 2015-07-13 NOTE — Progress Notes (Signed)
ANTIBIOTIC CONSULT NOTE - INITIAL  Pharmacy Consult for Zosyn Indication: Bacteremia  No Known Allergies  Patient Measurements: Height: 5\' 7"  (170.2 cm) Weight: 155 lb (70.308 kg) IBW/kg (Calculated) : 61.6  Vital Signs: Temp: 98.3 F (36.8 C) (11/18 0452) Temp Source: Oral (11/18 0452) BP: 131/79 mmHg (11/18 0452) Pulse Rate: 98 (11/18 0452) Intake/Output from previous day: 11/17 0701 - 11/18 0700 In: 750 [P.O.:700; IV Piggyback:50] Out: 275 [Urine:275]  Labs:  Recent Labs  07/11/15 1540 07/12/15 0035  WBC 17.4* 14.5*  HGB 14.6 12.8  PLT 263 240  CREATININE 0.77 0.80   Estimated Creatinine Clearance: 77.3 mL/min (by C-G formula based on Cr of 0.8). No results for input(s): VANCOTROUGH, VANCOPEAK, VANCORANDOM, GENTTROUGH, GENTPEAK, GENTRANDOM, TOBRATROUGH, TOBRAPEAK, TOBRARND, AMIKACINPEAK, AMIKACINTROU, AMIKACIN in the last 72 hours.   Microbiology: Recent Results (from the past 720 hour(s))  Urine culture     Status: None   Collection Time: 07/11/15  2:30 PM  Result Value Ref Range Status   Specimen Description URINE, RANDOM  Final   Special Requests NONE  Final   Culture   Final    MULTIPLE SPECIES PRESENT, SUGGEST RECOLLECTION Performed at [facility]    Report Status 07/12/2015 FINAL  Final  Culture, blood (routine x 2)     Status: None (Preliminary result)   Collection Time: 07/11/15  4:55 PM  Result Value Ref Range Status   Specimen Description [facility]LOOD RIGHT [facility]  Final   Special Requests BOTTLES DRAWN AEROBIC AND ANAEROBIC 5CC EACH  Final   Culture   Final    NO GROWTH < 24 HOURS Performed at [facility]    Report Status PENDING  Incomplete  Blood culture (routine x 2)     Status: None (Preliminary result)   Collection Time: 07/11/15  5:30 PM  Result Value Ref Range Status   Specimen Description BLOOD RIGHT HAND  Final   Special Requests BOTTLES DRAWN AEROBIC AND ANAEROBIC 5CC  Final   Culture  Setup Time   Final    GRAM NEGATIVE  RODS ANAEROBIC BOTTLE ONLY CRITICAL RESULT CALLED TO, READ BACK BY AND VERIFIED WITH: K SEAY,RN AT 0831 07/13/15 BY L BENFIELD    Culture   Final    GRAM NEGATIVE RODS Performed at [facility]    Report Status PENDING  Incomplete    Medical History: Past Medical History  Diagnosis Date  . Kidney stones     Assessment: [name] s/p placement of left double-J stent.  [facility] with [facility] flank pain, fever, and chills.  CT shows left distal ureteral stone with hydronephrosis.  She was initially started on Ceftriaxone, but now blood cultures are growing GNR.  Pharmacy is consulted to dose Zosyn.  Today, 07/13/2015:  Tmax/24h: 100.1  WBC elevated but improved to 14.5 (11/17)  SCr 0.8 with Crcl ~ 77 ml/min.  Goal of Therapy:  Appropriate abx dosing, eradication of infection.   Plan:   Zosyn 3.375g IV Q8H infused over 4hrs.  Follow up renal function, cultures, and clinical condition.   Lynann Beaverhristine Sun Wilensky PharmD, BCPS Pager [phone] 07/13/2015 9:20 AM

## 2015-07-13 NOTE — Progress Notes (Signed)
CRITICAL VALUE ALERT  Critical value received:  Blood culture: anaerobic bottle showing gram negative rods  Date of notification:  07/13/2015  Time of notification:  2108  Critical value read back:Yes.    Nurse who received alert:  Remi DeterJulia Deshae Dickison, RN  MD notified (1st page):  Urology on-call office  Time of first page:  2130  MD notified (2nd page):   Time of second page:  Responding MD:  Berneice HeinrichManny  Time MD responded:  2140

## 2015-07-13 NOTE — Discharge Instructions (Signed)

## 2015-07-13 NOTE — Progress Notes (Signed)
CRITICAL VALUE ALERT  Critical value received:  Blood culture from 07/11/15: Anaerobic bottle showing gram negative rods  Date of notification:  07/13/15  Time of notification:  0831   Critical value read back:Yes.    Nurse who received alert:  Phillis KnackKatelyn Octavis Sheeler, RN  MD notified (1st page):  Dr. Retta Dionesahlstedt   Time of first page:  [phone]   MD notified (2nd page):  Time of second page:  Responding MD:  Dr. Retta Dionesahlstedt  Time MD responded:  [phone]  T.O. received: Discontinue discharge orders.

## 2015-07-13 NOTE — Discharge Summary (Signed)
Patient ID: Terri MaroonLisa [facility] MRN: [id number] DOB/AGE: November 11, 1960 54 y.o.  Admit date: 07/11/2015 Discharge date: 07/13/2015  Primary Care Physician:  No PCP Per Patient  Discharge Diagnoses:   Present on Admission:  . Pyonephrosis  Consults:  None     Discharge Medications:   Medication List    TAKE these medications        acetaminophen 325 MG tablet  Commonly known as:  TYLENOL  Take 975 mg by mouth every 6 (six) hours as needed for moderate pain.     oxybutynin 5 MG tablet  Commonly known as:  DITROPAN  Take 1 tablet (5 mg total) by mouth every 8 (eight) hours as needed for bladder spasms.     oxyCODONE 5 MG immediate release tablet  Commonly known as:  Oxy IR/ROXICODONE  Take 1 tablet (5 mg total) by mouth every 4 (four) hours as needed for moderate pain.     sulfamethoxazole-trimethoprim 800-160 MG tablet  Commonly known as:  BACTRIM DS,SEPTRA DS  Take 1 tablet by mouth 2 (two) times daily.         Significant Diagnostic Studies:  Dg Chest 2 View  07/11/2015  CLINICAL DATA:  Cough since Saturday, abdominal pain since last night, history smoking, kidney stones EXAM: CHEST  2 VIEW COMPARISON:  None FINDINGS: Normal heart size, mediastinal contours, and pulmonary vascularity. Mild peribronchial thickening. No pulmonary infiltrate, pleural effusion, or pneumothorax. Bones unremarkable. IMPRESSION: Bronchitic changes without infiltrate. Electronically Signed   By: Ulyses SouthwardMark  Boles M.D.   On: 07/11/2015 15:00   Ct Renal Stone Study  07/11/2015  CLINICAL DATA:  Acute left flank pain. EXAM: CT ABDOMEN AND PELVIS WITHOUT CONTRAST TECHNIQUE: Multidetector CT imaging of the abdomen and pelvis was performed following the standard protocol without IV contrast. COMPARISON:  None. 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 Visualized lung bases are unremarkable. No gallstones are noted. No focal abnormality is noted in the liver, spleen or pancreas on  these unenhanced images. Adrenal glands appear normal. Small nonobstructive calculus is noted in lower pole collecting system of right kidney. Moderate to severe left hydroureteronephrosis is noted with perinephric stranding secondary to 9 x 5 mm calculus at left ureterovesical junction. The appendix appears normal. There is no evidence of abdominal aortic aneurysm. There is no evidence of bowel obstruction. Uterus and ovaries are unremarkable. No significant adenopathy is noted. IMPRESSION: Small nonobstructive right renal calculus. Moderate to severe left hydroureteronephrosis with perinephric stranding is noted secondary to 9 x 5 mm left ureterovesical junction calculus. Electronically Signed   By: Lupita RaiderJames  Green Jr, M.D.   On: 07/11/2015 16:19    Brief H and P: For complete details please refer to admission H and P, but in brief the patient was admitted for management of an obstructing left ureteral stone with pyonephrosis.  Hospital Course:  Active Problems:   Pyonephrosis  the patient was admitted for urgent management of an obstructing left ureteral stone and evidence of urinary tract infection. She was kept on broad-spectrum antibiotics. Both blood and urine cultures returned negative. The patient did not have a significant fever postoperatively. With her negative cultures, she was eventually discharged on postoperative day #2. At that time she was sent home with a 10 day course of Bactrim as well as Ditropan for bladder spasms. We will follow up with her in the next week or 2 to schedule eventual ureteroscopy.  Day of Discharge BP 131/79 mmHg  Pulse 98  Temp(Src) 98.3 F (  36.8 C) (Oral)  Resp 20  Ht  (1.702 m)  Wt 70.308 kg (155 lb)  BMI 24.27 kg/m2  SpO2 97%  No results found for this or any previous visit (from the past 24 hour(s)).  Physical Exam: General: Alert and awake oriented x3 not in any acute distress. HEENT: anicteric sclera, pupils reactive to light and  accommodation CVS: S1-S2 clear no murmur rubs or gallops Chest: clear to auscultation bilaterally, no wheezing rales or rhonchi Abdomen: soft nontender, nondistended, normal bowel sounds, no organomegaly Extremities: no cyanosis, clubbing or edema noted bilaterally Neuro: Cranial nerves II-XII intact, no focal neurological deficits  Disposition:  Home  Diet:  No restrictions  Activity:  Gradually increase   Disposition and Follow-up:    Follow-up in the office next week or 2 to schedule eventual ureteroscopic stone extraction  TESTS THAT NEED FOLLOW-UP  None  DISCHARGE FOLLOW-UP     Follow-up Information    Follow up with Chelsea Aus, MD.   Specialty:  Urology   Why:  we will call you   Contact information:   [address] [place] [address] [phone]       Time spent on Discharge:  10 minutes  Signed: Chelsea Aus 07/13/2015, 6:28 AM

## 2015-07-14 MED ORDER — BENZONATATE 100 MG PO CAPS
100.0000 mg | ORAL_CAPSULE | Freq: Three times a day (TID) | ORAL | Status: DC | PRN
  Administered 2015-07-14 – 2015-07-15 (×3): 100 mg via ORAL
  Filled 2015-07-14 (×3): qty 1

## 2015-07-14 MED ORDER — OXYCODONE HCL 5 MG PO TABS
5.0000 mg | ORAL_TABLET | ORAL | Status: DC | PRN
  Administered 2015-07-14: 10 mg via ORAL
  Administered 2015-07-14: 5 mg via ORAL
  Administered 2015-07-15 (×4): 10 mg via ORAL
  Filled 2015-07-14 (×3): qty 2
  Filled 2015-07-14: qty 1
  Filled 2015-07-14 (×2): qty 2

## 2015-07-14 NOTE — Progress Notes (Signed)
3 Days Post-Op  Subjective:  1 - Left Ureteral Stone - s/p left ureteral stent placement 07/11/15 for decompression from ureteral stone in setting of clinical urosepsis.  2 - Urosepsis / Gram Negative Bacteremia - GNR in blood CX x2 11/16, final pending. On empiric Zosyn.  Today "Misty StanleyLisa" is s/o complaints. Still some intermittent fevers, but now low grade.   Objective: Vital signs in last 24 hours: Temp:  [98.2 F (36.8 C)-100.2 F (37.9 C)] 98.5 F (36.9 C) (11/19 0610) Pulse Rate:  [68-86] 80 (11/19 0610) Resp:  [18] 18 (11/19 0610) BP: (127-146)/(82-93) 139/90 mmHg (11/19 0627) SpO2:  [92 %-96 %] 92 % (11/19 0610) Last BM Date: 07/13/15  Intake/Output from previous day: 11/18 0701 - 11/19 0700 In: 2248.8 [P.O.:360; I.V.:1738.8; IV Piggyback:150] Out: 1200 [Urine:1200] Intake/Output this shift:    General appearance: alert, cooperative and appears stated age Head: Normocephalic, without obvious abnormality, atraumatic Eyes: negative Nose: Nares normal. Septum midline. Mucosa normal. No drainage or sinus tenderness. Throat: lips, mucosa, and tongue normal; teeth and gums normal Neck: supple, symmetrical, trachea midline Resp: non-labored on room air.  Cardio: Nl rate GI: soft, non-tender; bowel sounds normal; no masses,  no organomegaly Pelvic: no foley Extremities: extremities normal, atraumatic, no cyanosis or edema Pulses: 2+ and symmetric Lymph nodes: Cervical, supraclavicular, and axillary nodes normal. Neurologic: Grossly normal  Lab Results:   Recent Labs  07/11/15 1540 07/12/15 0035  WBC 17.4* 14.5*  HGB 14.6 12.8  HCT 42.5 37.4  PLT 263 240   BMET  Recent Labs  07/11/15 1540 07/12/15 0035  NA 136  --   K 3.6  --   CL 96*  --   CO2 27  --   GLUCOSE 142*  --   BUN 12  --   CREATININE 0.77 0.80  CALCIUM 8.9  --    PT/INR No results for input(s): LABPROT, INR in the last 72 hours. ABG No results for input(s): PHART, HCO3 in the last 72  hours.  Invalid input(s): PCO2, PO2  Studies/Results: No results found.  Anti-infectives: Anti-infectives    Start     Dose/Rate Route Frequency Ordered Stop   07/13/15 1000  piperacillin-tazobactam (ZOSYN) IVPB 3.375 g     3.375 g 12.5 mL/hr over 240 Minutes Intravenous Every 8 hours 07/13/15 0919     07/13/15 0000  sulfamethoxazole-trimethoprim (BACTRIM DS,SEPTRA DS) 800-160 MG tablet     1 tablet Oral 2 times daily 07/13/15 0619     07/12/15 1600  cefTRIAXone (ROCEPHIN) 1 g in dextrose 5 % 50 mL IVPB  Status:  Discontinued     1 g 100 mL/hr over 30 Minutes Intravenous Every 24 hours 07/11/15 2338 07/13/15 0918   07/11/15 2345  cefTRIAXone (ROCEPHIN) 1 g in dextrose 5 % 50 mL IVPB  Status:  Discontinued     1 g 100 mL/hr over 30 Minutes Intravenous Every 24 hours 07/11/15 2337 07/11/15 2348   07/11/15 1723  cefTRIAXone (ROCEPHIN) 1 G injection    Comments:  Lucendia Herrlichdkins, Andrea   : cabinet override      07/11/15 1723 07/12/15 0529   07/11/15 1700  cefTRIAXone (ROCEPHIN) 1 g in dextrose 5 % 50 mL IVPB     1 g 100 mL/hr over 30 Minutes Intravenous  Once 07/11/15 1655 07/11/15 1901   07/11/15 1700  metroNIDAZOLE (FLAGYL) tablet 2,000 mg     2,000 mg Oral  Once 07/11/15 1655 07/11/15 1724      Assessment/Plan:  1 -  Left Ureteral Stone - s/p stent, will need SWL or ureteroscopy in elective setting when completely over infectious parameters.   2 - Urosepsis / Gram Negative Bacteremia - rec remain in house on IV ABX until sensitivities final and completely afebrile x 24 hrs. She has good understanding of this.  3 - Remain in house on IV ABX   [facility], Shivaun Bilello 07/14/2015

## 2015-07-14 NOTE — Progress Notes (Signed)
Patient having dry cough and complaining of 7/10 pain in R flank unrelieved by oxy IR. MD notified, new orders received.  Earnest ConroyBrooke M. Clelia CroftShaw, RN

## 2015-07-15 LAB — CULTURE, BLOOD (ROUTINE X 2)

## 2015-07-15 MED ORDER — CIPROFLOXACIN HCL 500 MG PO TABS: 500.0000 mg | ORAL_TABLET | Freq: Two times a day (BID) | ORAL | Status: DC

## 2015-07-15 MED ORDER — SENNOSIDES-DOCUSATE SODIUM 8.6-50 MG PO TABS: 1.0000 | ORAL_TABLET | Freq: Two times a day (BID) | ORAL | Status: DC

## 2015-07-15 NOTE — Discharge Summary (Signed)
Physician Discharge Summary  Patient ID: Terri MaroonLisa [facility]rence MRN: [id number] DOB/AGE: 1961/06/27 54 y.o.  Admit date: 07/11/2015 Discharge date: 07/15/2015  Admission Diagnoses:  Discharge Diagnoses:  Active Problems:   Pyonephrosis   Discharged Condition: good  Hospital Course:   1 - Left Ureteral Stone - s/p left ureteral stent placement 07/11/15 for decompression from ureteral stone in setting of clinical urosepsis.  2 - Urosepsis / Gram Negative Bacteremia from Proteus - GNR in blood CX x2 11/16, final CX pan-sensitive proteus (final on 11/20). On empiric Zosyn during hospitalization, will go home on Cipro BID x 2 weeks.   By 11/20, the day of discharge, pt is ambulatory, pain controlled by PO meds, is afebrile x 24 hrs from infection, and felt to be adequate for discharge.   Consults: None  Significant Diagnostic Studies: labs: as per above.  Treatments: IV hydration and surgery:  left ureteral stent placement 07/11/15   Discharge Exam: Blood pressure 129/79, pulse 70, temperature 98.2 F (36.8 C), temperature source Oral, resp. rate 18, height 5\' 7"  (1.702 m), weight 70.308 kg (155 lb), SpO2 97 %. General appearance: alert, cooperative and appears stated age Eyes: negative Nose: Nares normal. Septum midline. Mucosa normal. No drainage or sinus tenderness. Throat: lips, mucosa, and tongue normal; teeth and gums normal Neck: supple, symmetrical, trachea midline Back: symmetric, no curvature. ROM normal. No CVA tenderness. Resp: non-labored on room air Cardio: Nl rate GI: soft, non-tender; bowel sounds normal; no masses,  no organomegaly Extremities: extremities normal, atraumatic, no cyanosis or edema Pulses: 2+ and symmetric Skin: Skin color, texture, turgor normal. No rashes or lesions Lymph nodes: Cervical, supraclavicular, and axillary nodes normal. Neurologic: Grossly normal  Disposition: Final discharge disposition not confirmed     Medication List    TAKE  these medications        acetaminophen 325 MG tablet  Commonly known as:  TYLENOL  Take 975 mg by mouth every 6 (six) hours as needed for moderate pain.     ciprofloxacin 500 MG tablet  Commonly known as:  CIPRO  Take 1 tablet (500 mg total) by mouth 2 (two) times daily. X 2 weeks for continued treatment of severe infection.     oxybutynin 5 MG tablet  Commonly known as:  DITROPAN  Take 1 tablet (5 mg total) by mouth every 8 (eight) hours as needed for bladder spasms.     oxyCODONE 5 MG immediate release tablet  Commonly known as:  Oxy IR/ROXICODONE  Take 1 tablet (5 mg total) by mouth every 4 (four) hours as needed for moderate pain.     senna-docusate 8.6-50 MG tablet  Commonly known as:  Senokot-S  Take 1 tablet by mouth 2 (two) times daily. While taking pain meds to prevent constipation           Follow-up Information    Follow up with Chelsea AusAHLSTEDT, STEPHEN M, MD.   Specialty:  Urology   Why:  we will call you   Contact information:   [address] [place] [address] [phone]       Signed: Sebastian AcheMANNY, Elmer Merwin 07/15/2015, 4:24 PM

## 2015-07-15 NOTE — Clinical Documentation Improvement (Addendum)
Urology  (please document your query response in the progress notes and discharge summary, not on the BPA form.  CDI BPA's are not part of the permanent medical record.)  Please clarify the specific condition indicated by the term 'urosepsis'. This term is not able to be assigned to a code in ICD-10-CM.  Possible Clinical Conditions:  - Sepsis, present on admission (POA), please specify associated organism  - Sepsis, NOT present on admission, please specify associated organism and suspected or known cause  - Bacteremia without Sepsis, POA  - Other condition  - Unable to clinically determine  Clinical Information/Indicators: "Urosepsis / Gram Negative Bacteremia - GNR in blood CX x2 11/16, final pending. On empiric Zosyn" is documented by Dr. Berneice HeinrichManny 07/14/15 at 11:32 am. Blood Cxs -  Proteus Mirabilis, drawn on admission. Temp in ED 101.8 Heart Rate in the 90's to low 100's in ED WBC on admission - 17.4 Lactic Acid trend this admission - 0.45;  1.16   Please exercise your independent, professional judgment when responding. A specific answer is not anticipated or expected.   Thank You,  Jerral Ralphathy R Ahniya Mitchum  RN BSN CCDS [phone] [employer] Information [facility] [place]
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
## 2015-07-16 LAB — CULTURE, BLOOD (ROUTINE X 2)

## 2015-07-27 ENCOUNTER — Other Ambulatory Visit: Payer: Self-pay | Admitting: Urology

## 2015-08-29 ENCOUNTER — Emergency Department (HOSPITAL_BASED_OUTPATIENT_CLINIC_OR_DEPARTMENT_OTHER)
Admission: EM | Admit: 2015-08-29 | Discharge: 2015-08-29 | Disposition: A | Discharge status: Home / Self Care | Payer: BLUE CROSS/BLUE SHIELD | Attending: Emergency Medicine | Admitting: Emergency Medicine

## 2015-08-29 ENCOUNTER — Encounter (HOSPITAL_BASED_OUTPATIENT_CLINIC_OR_DEPARTMENT_OTHER): Payer: Self-pay | Admitting: Emergency Medicine

## 2015-08-29 ENCOUNTER — Emergency Department (HOSPITAL_BASED_OUTPATIENT_CLINIC_OR_DEPARTMENT_OTHER): Payer: BLUE CROSS/BLUE SHIELD

## 2015-08-29 DIAGNOSIS — Y9389 Activity, other specified: Secondary | ICD-10-CM | POA: Diagnosis not present

## 2015-08-29 DIAGNOSIS — S99922A Unspecified injury of left foot, initial encounter: Secondary | ICD-10-CM | POA: Diagnosis present

## 2015-08-29 DIAGNOSIS — Z87442 Personal history of urinary calculi: Secondary | ICD-10-CM | POA: Diagnosis not present

## 2015-08-29 DIAGNOSIS — S92402A Displaced unspecified fracture of left great toe, initial encounter for closed fracture: Secondary | ICD-10-CM

## 2015-08-29 DIAGNOSIS — W208XXA Other cause of strike by thrown, projected or falling object, initial encounter: Secondary | ICD-10-CM | POA: Diagnosis not present

## 2015-08-29 DIAGNOSIS — S92412A Displaced fracture of proximal phalanx of left great toe, initial encounter for closed fracture: Secondary | ICD-10-CM | POA: Insufficient documentation

## 2015-08-29 DIAGNOSIS — Y9289 Other specified places as the place of occurrence of the external cause: Secondary | ICD-10-CM | POA: Diagnosis not present

## 2015-08-29 DIAGNOSIS — F17200 Nicotine dependence, unspecified, uncomplicated: Secondary | ICD-10-CM | POA: Diagnosis not present

## 2015-08-29 DIAGNOSIS — Y998 Other external cause status: Secondary | ICD-10-CM | POA: Insufficient documentation

## 2015-08-29 MED ORDER — OXYCODONE-ACETAMINOPHEN 5-325 MG PO TABS: 1.0000 | ORAL_TABLET | Freq: Four times a day (QID) | ORAL | Status: DC | PRN

## 2015-08-29 MED FILL — OXYCODONE/APAP 5-325: 5-325 | 4 days supply | Qty: 16 | Fill #0

## 2015-08-29 NOTE — Discharge Instructions (Signed)
Call Dr. Lazaro ArmsHudnall's office for follow up.

## 2015-08-29 NOTE — ED Notes (Signed)
Patient states that she dropped her a plate on her foot yesterday  - pain to her left foot at the base of her big toe

## 2015-08-29 NOTE — ED Notes (Signed)
Ice pack given

## 2015-08-29 NOTE — ED Provider Notes (Signed)
CSN: [id number]     Arrival date & time 08/29/15  1039 History   First MD Initiated Contact with Patient 08/29/15 1235     Chief Complaint  Patient presents with  . Foot Pain     (Consider location/radiation/quality/duration/timing/severity/associated sxs/prior Treatment) Patient is a 55 y.o. female presenting with lower extremity pain. The history is provided by the patient.  Foot Pain This is a new problem. The current episode started yesterday. The problem occurs constantly. The problem has been gradually worsening. Associated symptoms include joint swelling. The symptoms are aggravated by standing and twisting. She has tried oral narcotics for the symptoms. The treatment provided moderate relief.   Terri Reyes is a 55 y.o. female who presents to the ED with pain of the left great toe after she dropped a glass plate on her foot yesterday. She took oxycodone that she has for her kidney stone and it did help the pain some. Today she went to work and the pain was so severe she had to leave. She has injured the same foot in the past.   Past Medical History  Diagnosis Date  . Kidney stones    Past Surgical History  Procedure Laterality Date  . Shoulder surgery    . Kidney stone surgery    . Cystoscopy with stent placement Left 07/11/2015    Procedure: CYSTOSCOPY WITH STENT PLACEMENT LEFT RETROGRADE;  Surgeon: Marcine Matar, MD;  Location: [facility];  Service: Urology;  Laterality: Left;   History reviewed. No pertinent family history. Social History  Substance Use Topics  . Smoking status: Current Every Day Smoker  . Smokeless tobacco: None  . Alcohol Use: Yes     Comment: weekly   OB History    No data available     Review of Systems  Musculoskeletal: Positive for joint swelling.       Left foot pain      Allergies  Review of patient's allergies indicates no known allergies.  Home Medications   Prior to Admission medications   Medication Sig Start Date End Date  Taking? Authorizing Provider  acetaminophen (TYLENOL) 325 MG tablet Take 975 mg by mouth every 6 (six) hours as needed for moderate pain.    Historical Provider, MD  ciprofloxacin (CIPRO) 500 MG tablet Take 1 tablet (500 mg total) by mouth 2 (two) times daily. X 2 weeks for continued treatment of severe infection. 07/15/15   Sebastian Ache, MD  oxybutynin (DITROPAN) 5 MG tablet Take 1 tablet (5 mg total) by mouth every 8 (eight) hours as needed for bladder spasms. 07/13/15   Marcine Matar, MD  oxyCODONE-acetaminophen (ROXICET) 5-325 MG tablet Take 1 tablet by mouth every 6 (six) hours as needed for severe pain. 08/29/15   Hope Orlene Och, NP  senna-docusate (SENOKOT-S) 8.6-50 MG tablet Take 1 tablet by mouth 2 (two) times daily. While taking pain meds to prevent constipation 07/15/15   Sebastian Ache, MD   BP 168/92 mmHg  Pulse 70  Temp(Src) 98.8 F (37.1 C) (Oral)  Resp 18  Ht 5\' 7"  (1.702 m)  Wt 70.308 kg  BMI 24.27 kg/m2  SpO2 100% Physical Exam  Constitutional: She is oriented to person, place, and time. She appears well-developed and well-nourished.  HENT:  Head: Normocephalic and atraumatic.  Eyes: Conjunctivae and EOM are normal.  Neck: Normal range of motion. Neck supple.  Cardiovascular: Normal rate.   Pulmonary/Chest: Effort normal.  Musculoskeletal:       Left foot: There is tenderness and swelling.  There is normal capillary refill and no laceration. Decreased range of motion: due to pain.       Feet:  Swelling, ecchymosis and tenderness to the base of the left great toe. The swelling and ecchymosis is to the dorsum and extends to the plantar aspect of the toe.   Neurological: She is alert and oriented to person, place, and time. No cranial nerve deficit.  Skin: Skin is warm and dry.  Psychiatric: She has a normal mood and affect. Her behavior is normal.  Nursing note and vitals reviewed.   ED Course  Procedures (including critical care time) X-ray, buddy tape, post op  shoe, crutches, pain management and follow up with ortho  Labs Review Labs Reviewed - No data to display  Imaging Review Dg Foot Complete Left  08/29/2015  CLINICAL DATA:  Dropped plate on left foot yesterday. Bruising in the distal medial foot. EXAM: LEFT FOOT - COMPLETE 3+ VIEW COMPARISON:  None. FINDINGS: Linear lucency distally in the proximal phalanx of the great toe is observed with suspected articular surface cortical discontinuity. However this is questionably superimposed on a chronic fracture and accordingly the chronicity is uncertain based on radiographic appearance alone. There is deformity of the proximal phalanx of the second toe distally, again with some chronic characteristics of callus formation, probably old. Scalloped and somewhat irregular appearance of the distal left fifth metatarsal metaphysis, again favor chronic deformity from old fracture, erosive process less likely. Lisfranc joint alignment normal. Large plantar calcaneal spur. Mild midfoot spurring. IMPRESSION: 1. Suspected acute oblique or longitudinal fracture of the distal margin of the proximal phalanx of the great toe, possibly superimposed on chronic deformity. The cortical discontinuity extends to the distal articular surface of the proximal phalanx. There also suspected old fractures the proximal phalanx of the second toe and of the distal fifth metatarsal metaphysis. Electronically Signed   By: Gaylyn RongWalter  Liebkemann M.D.   On: 08/29/2015 11:50   I have personally reviewed and evaluated these images and results as part of my medical decision-making.   MDM  55 y.o. female with pain, bruising and swelling to the left great toe s/p injury yesterday. Stable for d/c without open skin. Buddy tape, post op shoe, pain management, elevation, ice and follow up with ortho.  Discussed with the patient and all questioned fully answered. Final diagnoses:  Fracture of left great toe, closed, initial encounter        [employer] M  Neese, NP 08/29/15 1339  Lavera Guiseana Duo Liu, MD 08/29/15 2016

## 2015-08-31 ENCOUNTER — Encounter (HOSPITAL_BASED_OUTPATIENT_CLINIC_OR_DEPARTMENT_OTHER): Payer: Self-pay | Admitting: *Deleted

## 2015-08-31 NOTE — Anesthesia Preprocedure Evaluation (Addendum)
Anesthesia Evaluation  Patient identified by MRN, date of birth, ID band Patient awake    Reviewed: Allergy & Precautions, NPO status , Patient's Chart, lab work & pertinent test results  Airway Mallampati: II  TM Distance: >3 FB Neck ROM: Full    Dental no notable dental hx. (+) Teeth Intact, Dental Advisory Given   Pulmonary Current Smoker,    Pulmonary exam normal breath sounds clear to auscultation       Cardiovascular negative cardio ROS Normal cardiovascular exam Rhythm:Regular Rate:Normal     Neuro/Psych negative neurological ROS  negative psych ROS   GI/Hepatic negative GI ROS, Neg liver ROS,   Endo/Other  negative endocrine ROS  Renal/GU Renal diseaseL stone  negative genitourinary   Musculoskeletal negative musculoskeletal ROS (+)   Abdominal   Peds negative pediatric ROS (+)  Hematology negative hematology ROS (+)   Anesthesia Other Findings   Reproductive/Obstetrics negative OB ROS                            Anesthesia Physical  Anesthesia Plan  ASA: II and emergent  Anesthesia Plan: General   Post-op Pain Management:    Induction: Intravenous  Airway Management Planned: Oral ETT and LMA  Additional Equipment:   Intra-op Plan:   Post-operative Plan: Extubation in OR  Informed Consent: I have reviewed the patients History and Physical, chart, labs and discussed the procedure including the risks, benefits and alternatives for the proposed anesthesia with the patient or authorized representative who has indicated his/her understanding and acceptance.   Dental advisory given  Plan Discussed with: CRNA  Anesthesia Plan Comments:         Anesthesia Quick Evaluation

## 2015-08-31 NOTE — Progress Notes (Signed)
NPO AFTER MN.  ARRIVE AT 0600.  NEEDS ISTAT 8.  MAY TAKE OXYCODONE AM DOS W/ SIPS OF WATER.

## 2015-09-04 ENCOUNTER — Ambulatory Visit (INDEPENDENT_AMBULATORY_CARE_PROVIDER_SITE_OTHER): Payer: BLUE CROSS/BLUE SHIELD | Admitting: Family Medicine

## 2015-09-04 ENCOUNTER — Encounter: Payer: Self-pay | Admitting: Family Medicine

## 2015-09-04 VITALS — BP 141/102 | HR 75 | Ht 67.0 in | Wt 150.0 lb

## 2015-09-04 DIAGNOSIS — S92402A Displaced unspecified fracture of left great toe, initial encounter for closed fracture: Secondary | ICD-10-CM | POA: Diagnosis not present

## 2015-09-04 MED ORDER — OXYCODONE-ACETAMINOPHEN 5-325 MG PO TABS: 1.0000 | ORAL_TABLET | Freq: Four times a day (QID) | ORAL | Status: DC | PRN

## 2015-09-04 MED FILL — OXYCODONE/APAP 5-325: 5-325 | 15 days supply | Qty: 60 | Fill #0

## 2015-09-04 NOTE — Patient Instructions (Addendum)
You have a fractured great toe. This should heal well over 6 total weeks though these first 2-3 weeks are difficult. Take percocet as needed for severe pain. Ibuprofen or aleve as needed for pain and inflammation as well. Icing 15 minutes at a time 3-4 times a day. Buddy taping, postop shoe when up and walking around for 5 more weeks. Follow up with me in 5 weeks if you're having difficulty.

## 2015-09-06 ENCOUNTER — Encounter (HOSPITAL_BASED_OUTPATIENT_CLINIC_OR_DEPARTMENT_OTHER)
Admission: RE | Disposition: A | Discharge status: Home / Self Care | Payer: Self-pay | Source: Ambulatory Visit | Attending: Urology

## 2015-09-06 ENCOUNTER — Ambulatory Visit (HOSPITAL_BASED_OUTPATIENT_CLINIC_OR_DEPARTMENT_OTHER): Payer: BLUE CROSS/BLUE SHIELD | Admitting: Anesthesiology

## 2015-09-06 ENCOUNTER — Encounter (HOSPITAL_BASED_OUTPATIENT_CLINIC_OR_DEPARTMENT_OTHER): Payer: Self-pay | Admitting: *Deleted

## 2015-09-06 ENCOUNTER — Ambulatory Visit (HOSPITAL_BASED_OUTPATIENT_CLINIC_OR_DEPARTMENT_OTHER)
Admission: RE | Admit: 2015-09-06 | Discharge: 2015-09-06 | Disposition: A | Discharge status: Home / Self Care | Payer: BLUE CROSS/BLUE SHIELD | Source: Ambulatory Visit | Attending: Urology | Admitting: Urology

## 2015-09-06 DIAGNOSIS — N201 Calculus of ureter: Secondary | ICD-10-CM | POA: Diagnosis not present

## 2015-09-06 DIAGNOSIS — Z87442 Personal history of urinary calculi: Secondary | ICD-10-CM | POA: Insufficient documentation

## 2015-09-06 DIAGNOSIS — F17200 Nicotine dependence, unspecified, uncomplicated: Secondary | ICD-10-CM | POA: Diagnosis not present

## 2015-09-06 DIAGNOSIS — S92402A Displaced unspecified fracture of left great toe, initial encounter for closed fracture: Secondary | ICD-10-CM | POA: Insufficient documentation

## 2015-09-06 HISTORY — DX: Frequency of micturition: R35.0

## 2015-09-06 HISTORY — PX: CYSTOSCOPY WITH URETEROSCOPY: SHX5123

## 2015-09-06 HISTORY — DX: Personal history of other infectious and parasitic diseases: Z86.19

## 2015-09-06 HISTORY — DX: Calculus of ureter: N20.1

## 2015-09-06 HISTORY — PX: STONE EXTRACTION WITH BASKET: SHX5318

## 2015-09-06 HISTORY — DX: Calculus of kidney: N20.0

## 2015-09-06 HISTORY — DX: Personal history of urinary calculi: Z87.442

## 2015-09-06 HISTORY — PX: CYSTOSCOPY W/ URETERAL STENT PLACEMENT: SHX1429

## 2015-09-06 HISTORY — DX: Urgency of urination: R39.15

## 2015-09-06 LAB — POCT I-STAT, CHEM 8
BUN: 19 mg/dL (ref 6–20)
CALCIUM ION: 1.22 mmol/L (ref 1.12–1.23)
Chloride: 104 mmol/L (ref 101–111)
Creatinine, Ser: 0.6 mg/dL (ref 0.44–1.00)
GLUCOSE: 74 mg/dL (ref 65–99)
HCT: 42 % (ref 36.0–46.0)
Hemoglobin: 14.3 g/dL (ref 12.0–15.0)
Potassium: 3.9 mmol/L (ref 3.5–5.1)
SODIUM: 142 mmol/L (ref 135–145)
TCO2: 23 mmol/L (ref 0–100)

## 2015-09-06 SURGERY — CYSTOSCOPY, FLEXIBLE, WITH STENT REPLACEMENT
Anesthesia: General | Site: Renal | Laterality: Left

## 2015-09-06 MED ORDER — GENTAMICIN SULFATE 40 MG/ML IJ SOLN
1.5000 mg/kg | INTRAMUSCULAR | Status: DC
  Filled 2015-09-06: qty 2.75

## 2015-09-06 MED ORDER — ONDANSETRON HCL 4 MG/2ML IJ SOLN
INTRAMUSCULAR | Status: AC
  Filled 2015-09-06: qty 2

## 2015-09-06 MED ORDER — LIDOCAINE HCL (CARDIAC) 20 MG/ML IV SOLN
INTRAVENOUS | Status: DC | PRN
  Administered 2015-09-06: 100 mg via INTRAVENOUS

## 2015-09-06 MED ORDER — FENTANYL CITRATE (PF) 100 MCG/2ML IJ SOLN
INTRAMUSCULAR | Status: AC
  Filled 2015-09-06: qty 2

## 2015-09-06 MED ORDER — MIDAZOLAM HCL 2 MG/2ML IJ SOLN
INTRAMUSCULAR | Status: AC
  Filled 2015-09-06: qty 2

## 2015-09-06 MED ORDER — DEXTROSE 5 % IV SOLN
5.0000 mg/kg | Freq: Once | INTRAVENOUS | Status: AC
  Administered 2015-09-06: 340 mg via INTRAVENOUS
  Filled 2015-09-06 (×2): qty 8.5

## 2015-09-06 MED ORDER — KETOROLAC TROMETHAMINE 30 MG/ML IJ SOLN
INTRAMUSCULAR | Status: DC | PRN
  Administered 2015-09-06: 30 mg via INTRAVENOUS

## 2015-09-06 MED ORDER — AMPICILLIN SODIUM 1 G IJ SOLR
INTRAMUSCULAR | Status: AC
  Filled 2015-09-06: qty 1000

## 2015-09-06 MED ORDER — SODIUM CHLORIDE 0.9 % IV SOLN
INTRAVENOUS | Status: AC
  Filled 2015-09-06: qty 50

## 2015-09-06 MED ORDER — SODIUM CHLORIDE 0.9 % IR SOLN
Status: DC | PRN
  Administered 2015-09-06: 2000 mL

## 2015-09-06 MED ORDER — LACTATED RINGERS IV SOLN
INTRAVENOUS | Status: DC
  Administered 2015-09-06 (×2): via INTRAVENOUS
  Filled 2015-09-06: qty 1000

## 2015-09-06 MED ORDER — MEPERIDINE HCL 25 MG/ML IJ SOLN
6.2500 mg | INTRAMUSCULAR | Status: DC | PRN
  Filled 2015-09-06: qty 1

## 2015-09-06 MED ORDER — DOCUSATE SODIUM 100 MG PO CAPS: 100.0000 mg | ORAL_CAPSULE | Freq: Two times a day (BID) | ORAL | Status: DC

## 2015-09-06 MED ORDER — DEXAMETHASONE SODIUM PHOSPHATE 10 MG/ML IJ SOLN
INTRAMUSCULAR | Status: AC
  Filled 2015-09-06: qty 1

## 2015-09-06 MED ORDER — OXYCODONE-ACETAMINOPHEN 5-325 MG PO TABS
ORAL_TABLET | ORAL | Status: AC
  Filled 2015-09-06: qty 1

## 2015-09-06 MED ORDER — ONDANSETRON HCL 4 MG/2ML IJ SOLN
INTRAMUSCULAR | Status: DC | PRN
  Administered 2015-09-06: 4 mg via INTRAVENOUS

## 2015-09-06 MED ORDER — PROMETHAZINE HCL 25 MG/ML IJ SOLN
6.2500 mg | INTRAMUSCULAR | Status: DC | PRN
  Filled 2015-09-06: qty 1

## 2015-09-06 MED ORDER — PROPOFOL 10 MG/ML IV BOLUS
INTRAVENOUS | Status: AC
  Filled 2015-09-06: qty 20

## 2015-09-06 MED ORDER — OXYBUTYNIN CHLORIDE 5 MG PO TABS
ORAL_TABLET | ORAL | Status: AC
  Filled 2015-09-06: qty 1

## 2015-09-06 MED ORDER — OXYCODONE-ACETAMINOPHEN 5-325 MG PO TABS: 1.0000 | ORAL_TABLET | ORAL | Status: DC | PRN

## 2015-09-06 MED ORDER — OXYCODONE-ACETAMINOPHEN 5-325 MG PO TABS
1.0000 | ORAL_TABLET | Freq: Once | ORAL | Status: AC
Start: 2015-09-06 — End: 2015-09-06
  Administered 2015-09-06: 1 via ORAL
  Filled 2015-09-06: qty 1

## 2015-09-06 MED ORDER — PROPOFOL 10 MG/ML IV BOLUS
INTRAVENOUS | Status: DC | PRN
  Administered 2015-09-06: 250 mg via INTRAVENOUS

## 2015-09-06 MED ORDER — MIDAZOLAM HCL 5 MG/5ML IJ SOLN
INTRAMUSCULAR | Status: DC | PRN
  Administered 2015-09-06: 2 mg via INTRAVENOUS

## 2015-09-06 MED ORDER — LIDOCAINE HCL (CARDIAC) 20 MG/ML IV SOLN
INTRAVENOUS | Status: AC
  Filled 2015-09-06: qty 5

## 2015-09-06 MED ORDER — CEPHALEXIN 250 MG PO CAPS: 250.0000 mg | ORAL_CAPSULE | Freq: Four times a day (QID) | ORAL | Status: AC

## 2015-09-06 MED ORDER — TAMSULOSIN HCL 0.4 MG PO CAPS: 0.4000 mg | ORAL_CAPSULE | Freq: Every day | ORAL | Status: DC

## 2015-09-06 MED ORDER — FENTANYL CITRATE (PF) 100 MCG/2ML IJ SOLN
INTRAMUSCULAR | Status: DC | PRN
  Administered 2015-09-06: 25 ug via INTRAVENOUS
  Administered 2015-09-06: 50 ug via INTRAVENOUS
  Administered 2015-09-06 (×5): 25 ug via INTRAVENOUS

## 2015-09-06 MED ORDER — OXYBUTYNIN CHLORIDE 5 MG PO TABS
5.0000 mg | ORAL_TABLET | Freq: Once | ORAL | Status: AC
  Administered 2015-09-06: 5 mg via ORAL
  Filled 2015-09-06: qty 1

## 2015-09-06 MED ORDER — SODIUM CHLORIDE 0.9 % IV SOLN
2.0000 g | INTRAVENOUS | Status: AC
  Administered 2015-09-06: 2 g via INTRAVENOUS
  Filled 2015-09-06: qty 2000

## 2015-09-06 MED ORDER — OXYBUTYNIN CHLORIDE 5 MG PO TABS: 5.0000 mg | ORAL_TABLET | Freq: Three times a day (TID) | ORAL | Status: DC | PRN

## 2015-09-06 MED ORDER — FENTANYL CITRATE (PF) 100 MCG/2ML IJ SOLN
25.0000 ug | INTRAMUSCULAR | Status: DC | PRN
  Filled 2015-09-06: qty 1

## 2015-09-06 MED ORDER — DEXAMETHASONE SODIUM PHOSPHATE 10 MG/ML IJ SOLN
INTRAMUSCULAR | Status: DC | PRN
Start: 2015-09-06 — End: 2015-09-06
  Administered 2015-09-06: 10 mg via INTRAVENOUS

## 2015-09-06 SURGICAL SUPPLY — 30 items
ADAPTER CATH URET PLST 4-6FR (CATHETERS) IMPLANT
ADAPTER IRRIG TUBE 2 SPIKE SOL (ADAPTER) ×4 IMPLANT
BAG DRAIN URO-CYSTO SKYTR STRL (DRAIN) ×4 IMPLANT
BASKET STNLS GEMINI 4WIRE 3FR (BASKET) IMPLANT
BASKET STONE 1.7 NGAGE (UROLOGICAL SUPPLIES) ×4 IMPLANT
BASKET ZERO TIP NITINOL 2.4FR (BASKET) IMPLANT
CANISTER SUCT LVC 12 LTR MEDI- (MISCELLANEOUS) IMPLANT
CATH INTERMIT  6FR 70CM (CATHETERS) ×4 IMPLANT
CLOTH BEACON ORANGE TIMEOUT ST (SAFETY) ×4 IMPLANT
FIBER LASER FLEXIVA 365 (UROLOGICAL SUPPLIES) IMPLANT
FIBER LASER TRAC TIP (UROLOGICAL SUPPLIES) IMPLANT
GLOVE BIO SURGEON STRL SZ8 (GLOVE) ×4 IMPLANT
GOWN STRL REUS W/ TWL LRG LVL3 (GOWN DISPOSABLE) ×2 IMPLANT
GOWN STRL REUS W/ TWL XL LVL3 (GOWN DISPOSABLE) ×2 IMPLANT
GOWN STRL REUS W/TWL LRG LVL3 (GOWN DISPOSABLE) ×2
GOWN STRL REUS W/TWL XL LVL3 (GOWN DISPOSABLE) ×2
GUIDEWIRE 0.038 PTFE COATED (WIRE) IMPLANT
GUIDEWIRE ANG ZIPWIRE 038X150 (WIRE) IMPLANT
GUIDEWIRE STR DUAL SENSOR (WIRE) ×4 IMPLANT
IV NS 1000ML (IV SOLUTION) ×4
IV NS 1000ML BAXH (IV SOLUTION) ×4 IMPLANT
IV NS IRRIG 3000ML ARTHROMATIC (IV SOLUTION) IMPLANT
KIT ROOM TURNOVER WOR (KITS) ×4 IMPLANT
MANIFOLD NEPTUNE II (INSTRUMENTS) ×4 IMPLANT
NS IRRIG 500ML POUR BTL (IV SOLUTION) IMPLANT
PACK CYSTO (CUSTOM PROCEDURE TRAY) ×4 IMPLANT
SHEATH ACCESS URETERAL 38CM (SHEATH) IMPLANT
STENT URET 6FRX24 CONTOUR (STENTS) ×4 IMPLANT
TUBE CONNECTING 12'X1/4 (SUCTIONS)
TUBE CONNECTING 12X1/4 (SUCTIONS) IMPLANT

## 2015-09-06 NOTE — Discharge Instructions (Signed)
You may remove your stent by pulling the string in 4 days ( Monday 09/10/15). If you have difficulty or are anxious, please call the office and schedule a nursing appointment to come in and have one of our nurses assist you.  You have been discharged home with a ureteral stent in place. This is a temporary tube placed in the tube (aka ureter) that drains from the kidney to the bladder. This stent is TEMPORARY and it must be removed or exchanged within three months or else significant damage can occur.  Stents cause most patients to have side effects which may include urinary urgency, frequency, and/or pain with voiding. In a female, they can cause erection problems. They may also cause discomfort with strenuous activity. You will likely receive several medicines to help with these symptoms, for example, oxybutynin (ditropan) or tamsulosin (flomax). Oxybutynin helps quiet the bladder, but it may cause constipation or dry mouth. Please use over-the-counter laxatives or stool softeners to help with the former, and sucking on a sugar-free lozenge can help with dry mouth. Tamsulosin is usually taken at night before going to bed, and it helps with stent discomfort. You may also be prescribed a urinary analgesic (e.g. pyridium, prosed, uribel) to help with pain with urination that can change the color of your urine. Please do not be alarmed by this color change.  You may see some blood in your urine, but this is usually normal. If you have persistent bleeding that is not improving and/or large amounts of clots in your urine and the inability to urinate. CYSTOSCOPY HOME CARE INSTRUCTIONS  Activity: Rest for the remainder of the day.  Do not drive or operate equipment today.  You may resume normal activities in one to two days as instructed by your physician.   Meals: Drink plenty of liquids and eat light foods such as gelatin or soup this evening.  You may return to a normal meal plan tomorrow.  Return to  Work: You may return to work in one to two days or as instructed by your physician.  Special Instructions / Symptoms: Call your physician if any of these symptoms occur:   -persistent or heavy bleeding  -bleeding which continues after first few urination  -large blood clots that are difficult to pass  -urine stream diminishes or stops completely  -fever equal to or higher than 101 degrees Farenheit.  -cloudy urine with a strong, foul odor  -severe pain  Females should always wipe from front to back after elimination.  You may feel some burning pain when you urinate.  This should disappear with time.  Applying moist heat to the lower abdomen or a hot tub bath may help relieve the pain. \  Follow-Up / Date of Return Visit to Your Physician:  Call for an appointment to arrange follow-up.  Patient Signature:  ________________________________________________________  Nurse's Signature:  ________________________________________________________  Post Anesthesia Home Care Instructions  Activity: Get plenty of rest for the remainder of the day. A responsible adult should stay with you for 24 hours following the procedure.  For the next 24 hours, DO NOT: -Drive a car -Advertising copywriterperate machinery -Drink alcoholic beverages -Take any medication unless instructed by your physician -Make any legal decisions or sign important papers.  Meals: Start with liquid foods such as gelatin or soup. Progress to regular foods as tolerated. Avoid greasy, spicy, heavy foods. If nausea and/or vomiting occur, drink only clear liquids until the nausea and/or vomiting subsides. Call your physician if vomiting continues.  Special Instructions/Symptoms: Your throat may feel dry or sore from the anesthesia or the breathing tube placed in your throat during surgery. If this causes discomfort, gargle with warm salt water. The discomfort should disappear within 24 hours.  If you had a scopolamine patch placed behind your  ear for the management of post- operative nausea and/or vomiting:  1. The medication in the patch is effective for 72 hours, after which it should be removed.  Wrap patch in a tissue and discard in the trash. Wash hands thoroughly with soap and water. 2. You may remove the patch earlier than 72 hours if you experience unpleasant side effects which may include dry mouth, dizziness or visual disturbances. 3. Avoid touching the patch. Wash your hands with soap and water after contact with the patch.

## 2015-09-06 NOTE — Assessment & Plan Note (Signed)
Left great toe proximal phalanx fracture - Nondisplaced.  No malrotation or angulation.  Buddy taping, post op shoe.  Icing, elevation.  NSAIDs with percocet as needed.  F/u in 5 weeks if not improving.

## 2015-09-06 NOTE — Anesthesia Postprocedure Evaluation (Signed)
Anesthesia Post Note  Patient: Sherian MaroonLisa Stemen  Procedure(s) Performed: Procedure(s) (LRB): CYSTOSCOPY WITH STENT REPLACEMENT (Left) CYSTOSCOPY WITH URETEROSCOPY (Left) STONE EXTRACTION WITH BASKET (Left)  Patient location during evaluation: PACU Anesthesia Type: General Level of consciousness: awake and alert Pain management: pain level controlled Vital Signs Assessment: post-procedure vital signs reviewed and stable Respiratory status: spontaneous breathing, nonlabored ventilation, respiratory function stable and patient connected to nasal cannula oxygen Cardiovascular status: blood pressure returned to baseline and stable Postop Assessment: no signs of nausea or vomiting Anesthetic complications: no    Last Vitals:  Filed Vitals:   09/06/15 0809 09/06/15 0815  BP: 153/86 138/80  Pulse: 59 57  Temp: 36.6 C   Resp: 12 13    Last Pain: There were no vitals filed for this visit.               Petula Rotolo

## 2015-09-06 NOTE — H&P (Signed)
  H&P  Chief Complaint: Left kidney stone  History of Present Illness: Terri Reyes is a 55 y.o. year old female who presents for ureteroscopic mgmnt of a left ureteral stone. She was stented on 07/11/2015 when she presented w/ an obstructing left ureteral stone w/ associated infection. She was treated w/ an appropriate course of Cipro and presents now for definitive stone treatment.  Past Medical History  Diagnosis Date  . Left ureteral stone   . Nephrolithiasis     right-- nonobstructive per ct 07-11-2015  . History of sepsis     Admitted 07-11-2015  urosepsis  . Frequency of urination   . Urgency of urination   . History of kidney stones     Past Surgical History  Procedure Laterality Date  . Cystoscopy with stent placement Left 07/11/2015    Procedure: CYSTOSCOPY WITH STENT PLACEMENT LEFT RETROGRADE;  Surgeon: Marcine MatarStephen Dorella Laster, MD;  Location: [facility];  Service: Urology;  Laterality: Left;  . Ureterolithotomy  2007  . Shoulder arthroscopy w/ rotator cuff repair Right x4  last one 2010    last one included labral debridement    Home Medications:  Medications Prior to Admission  Medication Sig Dispense Refill  . acetaminophen (TYLENOL) 325 MG tablet Take 975 mg by mouth every 6 (six) hours as needed for moderate pain.    Marland Kitchen. oxyCODONE-acetaminophen (ROXICET) 5-325 MG tablet Take 1 tablet by mouth every 6 (six) hours as needed for severe pain. 60 tablet 0    Allergies: No Known Allergies  History reviewed. No pertinent family history.  Social History:  reports that she has been smoking Cigarettes.  She has a 30 pack-year smoking history. She has never used smokeless tobacco. She reports that she drinks alcohol. She reports that she does not use illicit drugs.  ROS: A complete review of systems was performed.  All systems are negative except for pertinent findings as noted.  Physical Exam:  Vital signs in last 24 hours: Temp:  [97.6 F (36.4 C)] 97.6 F (36.4 C) (01/12  0610) Pulse Rate:  [59] 59 (01/12 0610) Resp:  [16] 16 (01/12 0610) BP: (141)/(75) 141/75 mmHg (01/12 0610) SpO2:  [100 %] 100 % (01/12 0610) Weight:  [67.586 kg (149 lb)] 67.586 kg (149 lb) (01/12 0610) General:  Alert and oriented, No acute distress HEENT: Normocephalic, atraumatic Neck: No JVD or lymphadenopathy Cardiovascular: Regular rate and rhythm Lungs: Clear bilaterally Abdomen: Soft, nontender, nondistended, no abdominal masses Back: No CVA tenderness Extremities: No edema Neurologic: Grossly intact  Laboratory Data:  No results found for this or any previous visit (from the past 24 hour(s)). No results found for this or any previous visit (from the past 240 hour(s)). Creatinine: No results for input(s): CREATININE in the last 168 hours.  Radiologic Imaging: No results found.  Impression/Assessment: Left ureteral stone--with h/o associated infection and stent placement.  Plan:  Cystoscopy, left J2 stent extraction, left RGP, HLL/extraction of stone  Chelsea AusDAHLSTEDT, Dereck Agerton M 09/06/2015, 6:50 AM  Bertram MillardStephen M. Linzie Criss MD

## 2015-09-06 NOTE — Anesthesia Procedure Notes (Signed)
Procedure Name: LMA Insertion Date/Time: 09/06/2015 7:41 AM Performed by: Jessica PriestBEESON, Kijana Estock C Pre-anesthesia Checklist: Patient identified, Emergency Drugs available, Suction available and Patient being monitored Patient Re-evaluated:Patient Re-evaluated prior to inductionOxygen Delivery Method: Circle System Utilized Preoxygenation: Pre-oxygenation with 100% oxygen Intubation Type: IV induction Ventilation: Mask ventilation without difficulty LMA: LMA inserted LMA Size: 4.0 Number of attempts: 1 Airway Equipment and Method: bite block Placement Confirmation: positive ETCO2 Tube secured with: Tape Dental Injury: Teeth and Oropharynx as per pre-operative assessment

## 2015-09-06 NOTE — Transfer of Care (Signed)
Immediate Anesthesia Transfer of Care Note  Patient: Sherian MaroonLisa Stepney  Procedure(s) Performed: Procedure(s) (LRB): CYSTOSCOPY WITH STENT REPLACEMENT (Left) CYSTOSCOPY WITH URETEROSCOPY (Left) STONE EXTRACTION WITH BASKET (Left)  Patient Location: PACU  Anesthesia Type: General  Level of Consciousness: awake, sedated, patient cooperative and responds to stimulation  Airway & Oxygen Therapy: Patient Spontanous Breathing and Patient connected to face mask oxygen  Post-op Assessment: Report given to PACU RN, Post -op Vital signs reviewed and stable and Patient moving all extremities  Post vital signs: Reviewed and stable  Complications: No apparent anesthesia complications

## 2015-09-06 NOTE — Op Note (Signed)
Post-operative Diagnosis: LEFT distal ureteral stone 1. Ureteral stone  Procedure and Anesthesia:  Procedure(s) and Anesthesia Type:  1. Cystourethroscopy 2. Ureteral stent exchange 3. Ureteroscopy with  basket extraction 4. Intra-operative fluoroscopy <1 hr  Surgeon: Surgeon(s) and Role:    * Marcine MatarStephen Herron Fero, MD - Primary   Resident:  Loura PardonMatthew Lyons, MD  EBL: minimal  IVF: See anesthesia record  UOP: See anesthesia record  Drains:   LEFT 6 Fr x 24cm JJ ureteral stent with dangler  Implants: * No implants in log *  Specimens:  1. Stone for chemical analysis  Complications: * No complications entered in OR log *  Indications for Surgery: 55 y.o. female with nephrolithiasis. See pre-operative H&P for full details. Briefly she presented in November with an episode of sepsis of urinary origin (pan sensitive Proteus) in the setting of an obstructing 1 cm LEFT distal ureteral stone. She under went stent placement and now returns for definitive management. She has completed an appropriate course of antibiotics.Risks, benefits, and alternatives of the above procedure were discussed previously in detail and informed consents was signed and verified.  Findings:  Cystoscopy revealed normal urethra and bladder mucosa with indwelling non-encrusted LEFT JJ ureteral stent  Ureteroscopy revealed distal stone (soft, light yellow in appearance, roughly 1 cm in size), some inflamed mucosa proximally  KUB/retrograde pyelogram demonstrated successful placement of LEFT JJ ureteral stent.  Procedure Details: The patient and consent was verified in the pre-op holding area and brought to the operating room where they were placed on the operating table. Pre-induction time out was called and general anesthesia was induced. SCDs were placed and IV antibiotics were started.   We began by inserting the 22.5 Fr rigid cystoscope per urethra with ample lubrication and normal saline running. We performed  pan cystoscopy with the findings as documented above. We then turned our attention to the LEFT ureteral orifice with indwelling stent emanating which was grasped at its tip and pulled to the urethral meatus where a Sensor-tip 0.038 guidewire was placed and delivered to the upper pole of the right kidney under fluoroscopic guidance.   Leaving the wire in place we assembled the long semi-rigid dual-lumen ureteroscope which we carefully passed per urethra and gently into the ureteral orifice. We were able to drive up to the LEFT ureter where we encountered a tan, soft appearing stone stone. The stone was oblong and appeared amenable to safe basket extraction. The stone was grasped using the Ngage basket and was removed in two separate pieces which were sent for chemical analysis.  We then advanced the scope beyond the level of the stone as far as the ureter would accommodate and no additional stones were seen (up to the proximal ureter). We then re-inserted our cystoscope and emptied the bladder of all stone fragments.  At this point we proceeded with ureteral stent placement. We advanced a 6 Fr x 24 cm JJ stent over our sensor wire under fluoroscopic guidance. Appropriate placement of the proximal curl was noted in the renal pelvis and the distal curl was confirmed in the bladder. Stent appeared to be a good fit without the distal curl crossing the midline. The bladder was emptied, anesthesia was reversed and the patient awoke having tolerated the procedure well. They were transferred to the PACU in stable condition.  Post-operative plan:  -Discharge home per PACU protocol once voids x1 -Patient to remove stent at home in 4 days (Monday 09/10/15)  Teaching Physician Attestation: Dr. Retta Dionesahlstedt was present and  scrubbed for the entirety of the procedure  Madlyn Frankel. Nunzio Cory, MD Resident [facility] Department of Urologic Surgery/[facility]   I attended this procedure, participating in all aspects  of it.

## 2015-09-06 NOTE — Progress Notes (Signed)
PCP: No PCP Per Patient  Subjective:   HPI: Patient is a 55 y.o. female here for left toe injury.  Patient reports on 1/3 she accidentally dropped a plate on her left great toe when doing the dishes. Immediate pain, swelling, bruising. Has been icing, elevating, buddy taping and using postop shoe from ED. Pain is sharp with 6/10 level of pain dorsal great toe. Initially used crutches. Worse with ambulation. No skin changes, fever, other complaints.  Past Medical History  Diagnosis Date  . Left ureteral stone   . Nephrolithiasis     right-- nonobstructive per ct 07-11-2015  . History of sepsis     Admitted 07-11-2015  urosepsis  . Frequency of urination   . Urgency of urination   . History of kidney stones     Current Outpatient Prescriptions on File Prior to Visit  Medication Sig Dispense Refill  . acetaminophen (TYLENOL) 325 MG tablet Take 975 mg by mouth every 6 (six) hours as needed for moderate pain.    . cephALEXin (KEFLEX) 250 MG capsule Take 1 capsule (250 mg total) by mouth 4 (four) times daily. 12 capsule 0  . docusate sodium (COLACE) 100 MG capsule Take 1 capsule (100 mg total) by mouth 2 (two) times daily. 30 capsule 0  . oxybutynin (DITROPAN) 5 MG tablet Take 1 tablet (5 mg total) by mouth 3 (three) times daily as needed for bladder spasms. 20 tablet 0  . oxyCODONE-acetaminophen (ROXICET) 5-325 MG tablet Take 1 tablet by mouth every 4 (four) hours as needed for severe pain. 15 tablet 0  . tamsulosin (FLOMAX) 0.4 MG CAPS capsule Take 1 capsule (0.4 mg total) by mouth at bedtime. 14 capsule 0   Current Facility-Administered Medications on File Prior to Visit  Medication Dose Route Frequency Provider Last Rate Last Dose  . fentaNYL (SUBLIMAZE) injection 25-50 mcg  25-50 mcg Intravenous Q5 min PRN Sebastian Acheheodore Manny, MD      . lactated ringers infusion   Intravenous Continuous Sebastian Acheheodore Manny, MD 50 mL/hr at 09/06/15 0645    . meperidine (DEMEROL) injection 6.25-12.5 mg   6.25-12.5 mg Intravenous Q5 min PRN Sebastian Acheheodore Manny, MD      . promethazine (PHENERGAN) injection 6.25-12.5 mg  6.25-12.5 mg Intravenous Q15 min PRN Sebastian Acheheodore Manny, MD        Past Surgical History  Procedure Laterality Date  . Cystoscopy with stent placement Left 07/11/2015    Procedure: CYSTOSCOPY WITH STENT PLACEMENT LEFT RETROGRADE;  Surgeon: Marcine MatarStephen Dahlstedt, MD;  Location: [facility];  Service: Urology;  Laterality: Left;  . Ureterolithotomy  2007  . Shoulder arthroscopy w/ rotator cuff repair Right x4  last one 2010    last one included labral debridement    No Known Allergies  Social History   Social History  . Marital Status: Single    Spouse Name: N/A  . Number of Children: N/A  . Years of Education: N/A   Occupational History  . Not on file.   Social History Main Topics  . Smoking status: Current Every Day Smoker -- 0.75 packs/day for 40 years    Types: Cigarettes  . Smokeless tobacco: Never Used  . Alcohol Use: 0.0 oz/week    0 Standard drinks or equivalent per week     Comment: OCCASIONAL  . Drug Use: No  . Sexual Activity: Not on file   Other Topics Concern  . Not on file   Social History Narrative    No family history on file.  BP  141/102 mmHg  Pulse 75  Ht 5\' 7"  (1.702 m)  Wt 150 lb (68.04 kg)  BMI 23.49 kg/m2  Review of Systems: See HPI above.    Objective:  Physical Exam:  Gen: NAD  Left foot/ankle: Mild swelling and bruising great toe.  No other gross deformity, malrotation, angulation. Able to flex and extend 1st digit. TTP great toe, mainly proximal phalanx. Thompsons test negative. NV intact distally.    Right foot/ankle: FROM without pain.  Assessment & Plan:  1. Left great toe proximal phalanx fracture - Nondisplaced.  No malrotation or angulation.  Buddy taping, post op shoe.  Icing, elevation.  NSAIDs with percocet as needed.  F/u in 5 weeks if not improving.

## 2015-09-07 ENCOUNTER — Encounter (HOSPITAL_BASED_OUTPATIENT_CLINIC_OR_DEPARTMENT_OTHER): Payer: Self-pay | Admitting: Urology

## 2015-09-19 ENCOUNTER — Telehealth: Payer: Self-pay | Admitting: Family Medicine

## 2015-09-20 NOTE — Telephone Encounter (Signed)
We do not refill narcotics - we could call in meloxicam or voltaren if she would like these instead.  We do treat degenerative disc disease.

## 2015-09-21 NOTE — Telephone Encounter (Signed)
Scheduled appointment for the degenerative disc disease.

## 2015-09-24 ENCOUNTER — Encounter: Payer: Self-pay | Admitting: Family Medicine

## 2015-09-24 ENCOUNTER — Ambulatory Visit (INDEPENDENT_AMBULATORY_CARE_PROVIDER_SITE_OTHER): Payer: BLUE CROSS/BLUE SHIELD | Admitting: Family Medicine

## 2015-09-24 VITALS — BP 159/102 | HR 80 | Ht 67.0 in | Wt 150.0 lb

## 2015-09-24 DIAGNOSIS — M545 Low back pain, unspecified: Secondary | ICD-10-CM

## 2015-09-24 MED ORDER — DICLOFENAC SODIUM 75 MG PO TBEC: 75.0000 mg | DELAYED_RELEASE_TABLET | Freq: Two times a day (BID) | ORAL | Status: DC

## 2015-09-24 MED FILL — DICLOFENAC SOD EC 75 MG TAB: 75 | 30 days supply | Qty: 60 | Fill #0

## 2015-09-24 NOTE — Patient Instructions (Signed)
You have degenerative disc disease (and probably facet arthritis as well). Ok to take tylenol for baseline pain relief (1-2 extra strength tabs 3x/day) Consider prednisone dose pack Take voltaren twice a day with food for pain and inflammation. Consider muscle relaxant. Physical therapy - do home exercises on days you don't go to therapy. Strengthening of low back muscles, abdominal musculature are key for long term pain relief. If not improving, will consider further imaging (MRI). Follow up with me in 5-6 weeks for reevaluation.

## 2015-09-26 DIAGNOSIS — M545 Low back pain, unspecified: Secondary | ICD-10-CM | POA: Insufficient documentation

## 2015-09-26 NOTE — Progress Notes (Signed)
PCP: No PCP Per Patient  Subjective:   HPI: Patient is a 55 y.o. female here for back pain.  Patient reports several year history of low back pain centrally. Pain worse sitting down or lying down for a prolonged period then going to get up. No radiation into extremities. No numbness/tingling. Not worse with any specific motions. Pain level 4/10, dull. Told in past she had degenerative disc disease after some radiographs only. Has not had any other treatment for this.  Past Medical History  Diagnosis Date  . Left ureteral stone   . Nephrolithiasis     right-- nonobstructive per ct 07-11-2015  . History of sepsis     Admitted 07-11-2015  urosepsis  . Frequency of urination   . Urgency of urination   . History of kidney stones     Current Outpatient Prescriptions on File Prior to Visit  Medication Sig Dispense Refill  . acetaminophen (TYLENOL) 325 MG tablet Take 975 mg by mouth every 6 (six) hours as needed for moderate pain.    Marland Kitchen docusate sodium (COLACE) 100 MG capsule Take 1 capsule (100 mg total) by mouth 2 (two) times daily. 30 capsule 0  . oxybutynin (DITROPAN) 5 MG tablet Take 1 tablet (5 mg total) by mouth 3 (three) times daily as needed for bladder spasms. 20 tablet 0  . tamsulosin (FLOMAX) 0.4 MG CAPS capsule Take 1 capsule (0.4 mg total) by mouth at bedtime. 14 capsule 0   No current facility-administered medications on file prior to visit.    Past Surgical History  Procedure Laterality Date  . Cystoscopy with stent placement Left 07/11/2015    Procedure: CYSTOSCOPY WITH STENT PLACEMENT LEFT RETROGRADE;  Surgeon: Marcine Matar, MD;  Location: [facility];  Service: Urology;  Laterality: Left;  . Ureterolithotomy  2007  . Shoulder arthroscopy w/ rotator cuff repair Right x4  last one 2010    last one included labral debridement  . Cystoscopy w/ ureteral stent placement Left 09/06/2015    Procedure: CYSTOSCOPY WITH STENT REPLACEMENT;  Surgeon: Marcine Matar, MD;   [facility]: [facility];  Service: Urology;  Laterality: Left;  . Cystoscopy with ureteroscopy Left 09/06/2015    Procedure: CYSTOSCOPY WITH URETEROSCOPY;  Surgeon: Marcine Matar, MD;  Location: [facility];  Service: Urology;  Laterality: Left;  . Stone extraction with basket Left 09/06/2015    Procedure: STONE EXTRACTION WITH BASKET;  Surgeon: Marcine Matar, MD;  Location: [facility];  Service: Urology;  Laterality: Left;    No Known Allergies  Social History   Social History  . Marital Status: Single    Spouse Name: N/A  . Number of Children: N/A  . Years of Education: N/A   Occupational History  . Not on file.   Social History Main Topics  . Smoking status: Current Every Day Smoker -- 0.75 packs/day for 40 years    Types: Cigarettes  . Smokeless tobacco: Never Used  . Alcohol Use: 0.0 oz/week    0 Standard drinks or equivalent per week     Comment: OCCASIONAL  . Drug Use: No  . Sexual Activity: Not on file   Other Topics Concern  . Not on file   Social History Narrative    No family history on file.  BP 159/102 mmHg  Pulse 80  Ht 5\' 7"  (1.702 m)  Wt 150 lb (68.04 kg)  BMI 23.49 kg/m2  Review of Systems: See HPI above.    Objective:  Physical Exam:  Gen: NAD  Back: No gross deformity, scoliosis. TTP midline but no focal bony tenderness.  No paraspinal tenderness. FROM with mild pain on extension. Strength LEs 5/5 all muscle groups.   2+ MSRs in patellar and achilles tendons, equal bilaterally. Negative SLRs. Sensation intact to light touch bilaterally.  Bilateral hips: Negative logroll bilateral hips Negative fabers and piriformis stretches.    Assessment & Plan:  1. Low back pain - consistent with her previous diagnosis of degenerative disc disease, probable facet arthritis as well.  Tylenol for baseline pain relief.  Voltaren twice a day with food.  Start physical therapy and home exercises.   Consider imaging if not improving.  F/u in 5-6 weeks.

## 2015-09-26 NOTE — Assessment & Plan Note (Signed)
consistent with her previous diagnosis of degenerative disc disease, probable facet arthritis as well.  Tylenol for baseline pain relief.  Voltaren twice a day with food.  Start physical therapy and home exercises.  Consider imaging if not improving.  F/u in 5-6 weeks.

## 2015-10-09 ENCOUNTER — Ambulatory Visit: Payer: BLUE CROSS/BLUE SHIELD | Admitting: Family Medicine

## 2017-02-13 ENCOUNTER — Emergency Department (HOSPITAL_BASED_OUTPATIENT_CLINIC_OR_DEPARTMENT_OTHER)
Admission: EM | Admit: 2017-02-13 | Discharge: 2017-02-13 | Disposition: A | Discharge status: Home / Self Care | Payer: BLUE CROSS/BLUE SHIELD | Attending: Emergency Medicine | Admitting: Emergency Medicine

## 2017-02-13 ENCOUNTER — Encounter (HOSPITAL_BASED_OUTPATIENT_CLINIC_OR_DEPARTMENT_OTHER): Payer: Self-pay | Admitting: Emergency Medicine

## 2017-02-13 ENCOUNTER — Emergency Department (HOSPITAL_BASED_OUTPATIENT_CLINIC_OR_DEPARTMENT_OTHER): Payer: BLUE CROSS/BLUE SHIELD

## 2017-02-13 DIAGNOSIS — M545 Low back pain, unspecified: Secondary | ICD-10-CM

## 2017-02-13 DIAGNOSIS — M4856XA Collapsed vertebra, not elsewhere classified, lumbar region, initial encounter for fracture: Secondary | ICD-10-CM

## 2017-02-13 DIAGNOSIS — F17210 Nicotine dependence, cigarettes, uncomplicated: Secondary | ICD-10-CM | POA: Diagnosis not present

## 2017-02-13 HISTORY — DX: Other chronic pain: G89.29

## 2017-02-13 HISTORY — DX: Dorsalgia, unspecified: M54.9

## 2017-02-13 LAB — URINALYSIS, MICROSCOPIC (REFLEX): RBC / HPF: NONE SEEN RBC/hpf (ref 0–5)

## 2017-02-13 LAB — URINALYSIS, ROUTINE W REFLEX MICROSCOPIC
BILIRUBIN URINE: NEGATIVE
Glucose, UA: NEGATIVE mg/dL
HGB URINE DIPSTICK: NEGATIVE
KETONES UR: 15 mg/dL — AB
Nitrite: NEGATIVE
Protein, ur: NEGATIVE mg/dL
SPECIFIC GRAVITY, URINE: 1.025 (ref 1.005–1.030)
pH: 5.5 (ref 5.0–8.0)

## 2017-02-13 MED ORDER — CYCLOBENZAPRINE HCL 10 MG PO TABS: 10.0000 mg | ORAL_TABLET | Freq: Three times a day (TID) | ORAL | 0 refills | Status: DC

## 2017-02-13 MED ORDER — HYDROCODONE-ACETAMINOPHEN 5-325 MG PO TABS: 1.0000 | ORAL_TABLET | Freq: Four times a day (QID) | ORAL | 0 refills | Status: DC | PRN

## 2017-02-13 MED ORDER — HYDROMORPHONE HCL 1 MG/ML IJ SOLN
2.0000 mg | Freq: Once | INTRAMUSCULAR | Status: AC
  Administered 2017-02-13: 2 mg via INTRAMUSCULAR
  Filled 2017-02-13: qty 2

## 2017-02-13 MED ORDER — ONDANSETRON 4 MG PO TBDP
4.0000 mg | ORAL_TABLET | Freq: Once | ORAL | Status: AC
  Administered 2017-02-13: 4 mg via ORAL
  Filled 2017-02-13: qty 1

## 2017-02-13 MED FILL — HYDROCODON-APAP 5-325: 5-325 | 3 days supply | Qty: 20 | Fill #0

## 2017-02-13 MED FILL — CYCLOBENZAPRINE 10 MG TABLE: 10 | 7 days supply | Qty: 20 | Fill #0

## 2017-02-13 NOTE — ED Notes (Signed)
Pt able to stand and pivot unassisted. C/o back pain only

## 2017-02-13 NOTE — Discharge Instructions (Signed)
Take pain medicine as needed. Also take the muscle relaxer Flexeril as directed. Make an appointment to follow-up with sports medicine. X-rays did show evidence of a first lumbar compression fracture. Certainly could explain pain. All fractures take about 8 weeks to heal. But usually will start to improve after 2 weeks. Return for any new or worse symptoms. Work note provided.

## 2017-02-13 NOTE — ED Provider Notes (Signed)
[facility] Note   CSN: [id number] Arrival date & time: 02/13/17  0747     History   Chief Complaint Chief Complaint  Patient presents with  . Back Pain    HPI Terri Reyes is a 56 y.o. female.  Patient with the complaint of back pain. Said trouble in the past. The starting yesterday, constant sharp pain lumbar area that radiates to both sides. Does not radiate down into the legs. No numbness no tingling. No real dysuria. Patient without any history of any direct injury however does a lot of lifting at work.. Painful to move. Any movement or even coughing makes the low back hurt. Pain is 10 out of 10.      Past Medical History:  Diagnosis Date  . Chronic back pain   . Frequency of urination   . History of kidney stones   . History of sepsis    Admitted 07-11-2015  urosepsis  . Left ureteral stone   . Nephrolithiasis    right-- nonobstructive per ct 07-11-2015  . Urgency of urination     Patient Active Problem List   Diagnosis Date Noted  . Low back pain 09/26/2015  . Fracture of great toe, left, closed 09/06/2015  . Pyonephrosis 07/11/2015    Past Surgical History:  Procedure Laterality Date  . CYSTOSCOPY W/ URETERAL STENT PLACEMENT Left 09/06/2015   Procedure: CYSTOSCOPY WITH STENT REPLACEMENT;  Surgeon: Marcine Matar, MD;  Location: [facility];  Service: Urology;  Laterality: Left;  . CYSTOSCOPY WITH STENT PLACEMENT Left 07/11/2015   Procedure: CYSTOSCOPY WITH STENT PLACEMENT LEFT RETROGRADE;  Surgeon: Marcine Matar, MD;  Location: [facility];  Service: Urology;  Laterality: Left;  . CYSTOSCOPY WITH URETEROSCOPY Left 09/06/2015   Procedure: CYSTOSCOPY WITH URETEROSCOPY;  Surgeon: Marcine Matar, MD;  [facility];  Service: Urology;  Laterality: Left;  . SHOULDER ARTHROSCOPY W/ ROTATOR CUFF REPAIR Right x4  last one 2010   last one included labral debridement  . STONE EXTRACTION WITH BASKET  Left 09/06/2015   Procedure: STONE EXTRACTION WITH BASKET;  Surgeon: Marcine Matar, MD;  Location: [facility];  Service: Urology;  Laterality: Left;  . URETEROLITHOTOMY  2007    OB History    No data available       Home Medications    Prior to Admission medications   Medication Sig Start Date End Date Taking? Authorizing Provider  cyclobenzaprine (FLEXERIL) 10 MG tablet Take 1 tablet (10 mg total) by mouth 3 (three) times daily. 02/13/17   Vanetta Mulders, MD  HYDROcodone-acetaminophen (NORCO/VICODIN) 5-325 MG tablet Take 1-2 tablets by mouth every 6 (six) hours as needed for moderate pain. 02/13/17   Vanetta Mulders, MD    Family History No family history on file.  Social History Social History  Substance Use Topics  . Smoking status: Current Every Day Smoker    Packs/day: 0.75    Years: 40.00    Types: Cigarettes  . Smokeless tobacco: Never Used  . Alcohol use 0.0 oz/week     Comment: OCCASIONAL     Allergies   Patient has no known allergies.   Review of Systems Review of Systems  Constitutional: Negative for fever.  HENT: Negative for congestion.   Eyes: Negative for visual disturbance.  Respiratory: Negative for shortness of breath.   Cardiovascular: Negative for chest pain.  Gastrointestinal: Negative for abdominal pain.  Genitourinary: Negative for dysuria.  Musculoskeletal: Positive for back pain. Negative for neck pain.  Neurological: Negative for weakness and numbness.  Hematological: Does not bruise/bleed easily.  Psychiatric/Behavioral: Negative for confusion.     Physical Exam Updated Vital Signs BP (!) 165/106 (BP Location: Right Arm)   Pulse 72   Temp 98.7 F (37.1 C) (Oral)   Resp 20   Ht 1.702 m (5\' 7" )   Wt 68 kg (150 lb)   SpO2 100%   BMI 23.49 kg/m   Physical Exam  Constitutional: She is oriented to person, place, and time. She appears well-developed and well-nourished. No distress.  HENT:  Head:  Normocephalic and atraumatic.  Mouth/Throat: Oropharynx is clear and moist.  Eyes: Conjunctivae and EOM are normal. Pupils are equal, round, and reactive to light.  Neck: Normal range of motion.  Cardiovascular: Normal rate, regular rhythm and normal heart sounds.   Pulmonary/Chest: Effort normal and breath sounds normal.  Abdominal: Soft. Bowel sounds are normal. There is no tenderness.  Musculoskeletal: Normal range of motion. She exhibits tenderness.  Neurological: She is alert and oriented to person, place, and time. No cranial nerve deficit or sensory deficit. She exhibits normal muscle tone. Coordination normal.  Skin: Skin is warm.  Nursing note and vitals reviewed.    ED Treatments / Results  Labs (all labs ordered are listed, but only abnormal results are displayed) Labs Reviewed  URINALYSIS, ROUTINE W REFLEX MICROSCOPIC - Abnormal; Notable for the following:       Result Value   Ketones, ur 15 (*)    Leukocytes, UA SMALL (*)    All other components within normal limits  URINALYSIS, MICROSCOPIC (REFLEX) - Abnormal; Notable for the following:    Bacteria, UA MANY (*)    Squamous Epithelial / LPF 6-30 (*)    All other components within normal limits    EKG  EKG Interpretation None       Radiology Dg Lumbar Spine Complete  Result Date: 02/13/2017 CLINICAL DATA:  56 year old female with acute onset right side lumbar back pain, worse with movement. No known injury. EXAM: LUMBAR SPINE - COMPLETE 4+ VIEW COMPARISON:  CT Abdomen and Pelvis 07/11/2015 FINDINGS: Normal lumbar segmentation. L1 compression deformity is new since 2016. There is mild associated sclerosis of the superior endplate. Loss of height is 25-30% compared to 2016. No retropulsed bone is evident. Other lumbar vertebral height and alignment is stable since the prior CT. Advanced chronic disc degeneration at L3-L4 and L4-L5 including chronic vacuum disc and endplate spurring. Visible lower thoracic levels  appear intact. No pars fracture. Sacral ala and SI joints appear grossly intact. IMPRESSION: 1. L1 superior endplate compression fracture, new since 2016 and suspicious for acute or subacute fracture in this clinical setting. If specific therapy such as vertebroplasty is desired, Lumbar MRI (noncontrast) or Nuclear Medicine Whole-body Bone Scan would best confirm acuity. 2. No other acute osseous abnormality. 3. Advanced chronic disc degeneration at L3-L4 and L4-L5. Electronically Signed   By: Odessa Fleming M.D.   On: 02/13/2017 09:48    Procedures Procedures (including critical care time)  Medications Ordered in ED Medications  HYDROmorphone (DILAUDID) injection 2 mg (2 mg Intramuscular Given 02/13/17 0906)  ondansetron (ZOFRAN-ODT) disintegrating tablet 4 mg (4 mg Oral Given 02/13/17 0906)     Initial Impression / Assessment and Plan / ED Course  I have reviewed the triage vital signs and the nursing notes.  Pertinent labs & imaging results that were available during my care of the patient were reviewed by me and considered in my medical decision making (  see chart for details).     Patient with a complaint of low lumbar back pain bilaterally. No neuro deficits nothing consistent with sciatica. X-ray show evidence of an endplate compression fracture of L1. Maybe be playing a role with the pain. Patient does a lot of lifting at work. So certainly could've had injury occur there. Will be treated symptomatically with pain medicine and muscle relaxers. Patient is scheduled to work tomorrow and then goes on vacation. Work note provided. Recommend follow-up with sports medicine referral provided. Patient seen by them in the past.  Final Clinical Impressions(s) / ED Diagnoses   Final diagnoses:  Acute bilateral low back pain without sciatica  Non-traumatic compression fracture of first lumbar vertebra, initial encounter (HCC)    New Prescriptions New Prescriptions   CYCLOBENZAPRINE (FLEXERIL) 10 MG  TABLET    Take 1 tablet (10 mg total) by mouth 3 (three) times daily.   HYDROCODONE-ACETAMINOPHEN (NORCO/VICODIN) 5-325 MG TABLET    Take 1-2 tablets by mouth every 6 (six) hours as needed for moderate pain.     Vanetta MuldersZackowski, Velton Roselle, MD 02/13/17 1118

## 2017-02-13 NOTE — ED Triage Notes (Signed)
Pt has chronic back pain intermittently. Yesterday it became constant, sharp to center of back radiating to left buttocks. No dysuria. No numbness or tingling.

## 2017-02-13 NOTE — ED Notes (Signed)
Pt in wheelchair. 

## 2017-02-20 ENCOUNTER — Ambulatory Visit (INDEPENDENT_AMBULATORY_CARE_PROVIDER_SITE_OTHER): Payer: BLUE CROSS/BLUE SHIELD | Admitting: Family Medicine

## 2017-02-20 ENCOUNTER — Encounter: Payer: Self-pay | Admitting: Family Medicine

## 2017-02-20 DIAGNOSIS — M545 Low back pain, unspecified: Secondary | ICD-10-CM

## 2017-02-20 MED ORDER — OXYCODONE-ACETAMINOPHEN 7.5-325 MG PO TABS: 1.0000 | ORAL_TABLET | ORAL | 0 refills | Status: DC | PRN

## 2017-02-20 MED ORDER — DICLOFENAC SODIUM 75 MG PO TBEC: 75.0000 mg | DELAYED_RELEASE_TABLET | Freq: Two times a day (BID) | ORAL | 1 refills | Status: DC

## 2017-02-20 MED FILL — DICLOFENAC SODIUM 75 MG TAB: 75 | 30 days supply | Qty: 60 | Fill #0

## 2017-02-20 MED FILL — OXYCODONE/APAP 7.5/325MG: 7.5-325 | 5 days supply | Qty: 30 | Fill #0

## 2017-02-20 NOTE — Patient Instructions (Signed)
You have a lumbar spine compression fracture. Take calcium 1300mg  daily, vitamin D 800 IU daily. Diclofenac 75mg  twice a day with food for pain and inflammation. Icing 15 minutes at a time 3-4 times a day. Back brace if this reduces pain but don't lift more because of this. Percocet as needed for severe pain - no driving on this. Topical capsaicin or biofreeze up to 4 times a day for pain. Follow up with me in 2 weeks for reevaluation. In the future we will need to do a bone density test.

## 2017-02-21 NOTE — Assessment & Plan Note (Signed)
independently reviewed radiographs and compared to prior imaging - new L1 compression fracture.  Discussed calcium, vitamin D.  Diclofenac, icing, percocet as needed for pain.  Topical medicines reviewed also.  F/u in 2 weeks.  We discussed calcitonin nasal spray, back brace also.  Discouraged vertebroplasty/kyphoplasty.

## 2017-02-21 NOTE — Progress Notes (Signed)
PCP: Terri Reyes, No Pcp Per  Subjective:   HPI: Terri Reyes is a 56 y.o. female here for low back pain.  Terri Reyes reports about 1 week ago she was moving heavy boxes at work when she felt a sharp pain in middle of low back. Seemed to be ok but the next day pain was severe. Pain now 4/10, up to 9/10 and sharp at worst. No radiation into legs. No numbness/tingling. No known osteoporosis - has not had bone density test however.  Past Medical History:  Diagnosis Date  . Chronic back pain   . Frequency of urination   . History of kidney stones   . History of sepsis    Admitted 07-11-2015  urosepsis  . Left ureteral stone   . Nephrolithiasis    right-- nonobstructive per ct 07-11-2015  . Urgency of urination     No current outpatient prescriptions on file prior to visit.   No current facility-administered medications on file prior to visit.     Past Surgical History:  Procedure Laterality Date  . CYSTOSCOPY W/ URETERAL STENT PLACEMENT Left 09/06/2015   Procedure: CYSTOSCOPY WITH STENT REPLACEMENT;  Surgeon: Marcine MatarStephen Dahlstedt, MD;  [facility];  Service: Urology;  Laterality: Left;  . CYSTOSCOPY WITH STENT PLACEMENT Left 07/11/2015   Procedure: CYSTOSCOPY WITH STENT PLACEMENT LEFT RETROGRADE;  Surgeon: Marcine MatarStephen Dahlstedt, MD;  Location: [facility];  Service: Urology;  Laterality: Left;  . CYSTOSCOPY WITH URETEROSCOPY Left 09/06/2015   Procedure: CYSTOSCOPY WITH URETEROSCOPY;  Surgeon: Marcine MatarStephen Dahlstedt, MD;  [facility] [place];  Service: Urology;  Laterality: Left;  . SHOULDER ARTHROSCOPY W/ ROTATOR CUFF REPAIR Right x4  last one 2010   last one included labral debridement  . STONE EXTRACTION WITH BASKET Left 09/06/2015   Procedure: STONE EXTRACTION WITH BASKET;  Surgeon: Marcine MatarStephen Dahlstedt, MD;  [facility];  Service: Urology;  Laterality: Left;  . URETEROLITHOTOMY  2007    No Known Allergies  Social History   Social  History  . Marital status: Single    Spouse name: N/A  . Number of children: N/A  . Years of education: N/A   Occupational History  . Not on file.   Social History Main Topics  . Smoking status: Current Every Day Smoker    Packs/day: 0.75    Years: 40.00    Types: Cigarettes  . Smokeless tobacco: Never Used  . Alcohol use 0.0 oz/week     Comment: OCCASIONAL  . Drug use: No  . Sexual activity: Not on file   Other Topics Concern  . Not on file   Social History Narrative  . No narrative on file    No family history on file.  BP (!) 167/98   Pulse 68   Ht 5\' 7"  (1.702 m)   Wt 150 lb (68 kg)   BMI 23.49 kg/m   Review of Systems: See HPI above.     Objective:  Physical Exam:  Gen: NAD, comfortable in exam room  Back: No gross deformity, scoliosis. TTP midline upper lumbar to mid lumbar back but no stepoffs. FROM with mild worsening of pain with flexion and extension. Strength LEs 5/5 all muscle groups.   2+ MSRs in patellar and achilles tendons, equal bilaterally. Negative SLRs. Sensation intact to light touch bilaterally. Negative logroll bilateral hips.   Assessment & Plan:  1. Low back pain - independently reviewed radiographs and compared to prior imaging - new L1 compression fracture.  Discussed calcium, vitamin  D.  Diclofenac, icing, percocet as needed for pain.  Topical medicines reviewed also.  F/u in 2 weeks.  We discussed calcitonin nasal spray, back brace also.  Discouraged vertebroplasty/kyphoplasty.

## 2017-02-26 ENCOUNTER — Telehealth: Payer: Self-pay | Admitting: Family Medicine

## 2017-02-26 MED ORDER — OXYCODONE-ACETAMINOPHEN 7.5-325 MG PO TABS: 1.0000 | ORAL_TABLET | Freq: Four times a day (QID) | ORAL | 0 refills | Status: DC | PRN

## 2017-02-26 MED FILL — OXYCODONE/APAP 7.5/325MG: 7.5-325 | 15 days supply | Qty: 60 | Fill #0

## 2017-02-26 NOTE — Telephone Encounter (Signed)
Spoke to patient and she stated that she is using the diclofenac, icing and topical medicine. Patient to come by and pick up prescription and only use the percocet as needed in addition to the above.

## 2017-02-26 NOTE — Telephone Encounter (Signed)
Medication Refill  °Percocet  °

## 2017-02-26 NOTE — Telephone Encounter (Signed)
Please make sure she's using diclofenac, icing, topical medicines and using the percocet only as needed in addition to this and to use as sparingly as possible.  Will provide refill for percocet for compression fracture.  Will consider calcitonin nasal spray in future.

## 2017-03-09 ENCOUNTER — Ambulatory Visit (INDEPENDENT_AMBULATORY_CARE_PROVIDER_SITE_OTHER): Payer: BLUE CROSS/BLUE SHIELD | Admitting: Family Medicine

## 2017-03-09 DIAGNOSIS — M545 Low back pain, unspecified: Secondary | ICD-10-CM

## 2017-03-09 MED ORDER — OXYCODONE-ACETAMINOPHEN 5-325 MG PO TABS: 1.0000 | ORAL_TABLET | Freq: Four times a day (QID) | ORAL | 0 refills | Status: DC | PRN

## 2017-03-09 MED FILL — OXYCODONE/APAP 5-325: 5-325 | 8 days supply | Qty: 30 | Fill #0

## 2017-03-09 NOTE — Patient Instructions (Addendum)
You have a lumbar spine compression fracture. Take calcium 1300mg  daily, vitamin D 800 IU daily. Use diclofenac OR aleve but not both. Icing 15 minutes at a time 3-4 times a day. Back brace if this reduces pain but don't lift more because of this. Percocet as needed for severe pain - no driving on this. Topical capsaicin or biofreeze up to 4 times a day for pain. Follow up with me in about 3 weeks for reevaluation. In the future we will need to do a bone density test. We may consider physical therapy also.

## 2017-03-09 NOTE — Progress Notes (Signed)
PCP: Patient, No Pcp Per  Subjective:   HPI: Patient is a 56 y.o. female here for low back pain.  6/29: Patient reports about 1 week ago she was moving heavy boxes at work when she felt a sharp pain in middle of low back. Seemed to be ok but the next day pain was severe. Pain now 4/10, up to 9/10 and sharp at worst. No radiation into legs. No numbness/tingling. No known osteoporosis - has not had bone density test however.  7/16: Patient reports she is improving. Pain at worst 4/10 now, can be sharp mid low back. No radiation into legs. No numbness or tingling. Worse pain with prolonged standing or sitting. Taking aleve or diclofenac. Takes a couple Microbiologist a day now. Brace has been helpful. No skin changes.  Past Medical History:  Diagnosis Date  . Chronic back pain   . Frequency of urination   . History of kidney stones   . History of sepsis    Admitted 07-11-2015  urosepsis  . Left ureteral stone   . Nephrolithiasis    right-- nonobstructive per ct 07-11-2015  . Urgency of urination     Current Outpatient Prescriptions on File Prior to Visit  Medication Sig Dispense Refill  . diclofenac (VOLTAREN) 75 MG EC tablet Take 1 tablet (75 mg total) by mouth 2 (two) times daily. 60 tablet 1   No current facility-administered medications on file prior to visit.     Past Surgical History:  Procedure Laterality Date  . CYSTOSCOPY W/ URETERAL STENT PLACEMENT Left 09/06/2015   Procedure: CYSTOSCOPY WITH STENT REPLACEMENT;  Surgeon: Marcine Matar, MD;  Location: [facility];  Service: Urology;  Laterality: Left;  . CYSTOSCOPY WITH STENT PLACEMENT Left 07/11/2015   Procedure: CYSTOSCOPY WITH STENT PLACEMENT LEFT RETROGRADE;  Surgeon: Marcine Matar, MD;  Location: [facility];  Service: Urology;  Laterality: Left;  . CYSTOSCOPY WITH URETEROSCOPY Left 09/06/2015   Procedure: CYSTOSCOPY WITH URETEROSCOPY;  Surgeon: Marcine Matar, MD;  Location: [facility];  Service: Urology;  Laterality: Left;  . SHOULDER ARTHROSCOPY W/ ROTATOR CUFF REPAIR Right x4  last one 2010   last one included labral debridement  . STONE EXTRACTION WITH BASKET Left 09/06/2015   Procedure: STONE EXTRACTION WITH BASKET;  Surgeon: Marcine Matar, MD;  Location: [facility];  Service: Urology;  Laterality: Left;  . URETEROLITHOTOMY  2007    No Known Allergies  Social History   Social History  . Marital status: Single    Spouse name: N/A  . Number of children: N/A  . Years of education: N/A   Occupational History  . Not on file.   Social History Main Topics  . Smoking status: Current Every Day Smoker    Packs/day: 0.75    Years: 40.00    Types: Cigarettes  . Smokeless tobacco: Never Used  . Alcohol use 0.0 oz/week     Comment: OCCASIONAL  . Drug use: No  . Sexual activity: Not on file   Other Topics Concern  . Not on file   Social History Narrative  . No narrative on file    No family history on file.  BP (!) 141/86   Pulse (!) 57   Ht 5\' 7"  (1.702 m)   Wt 150 lb (68 kg)   BMI 23.49 kg/m   Review of Systems: See HPI above.     Objective:  Physical Exam:  Gen: NAD, comfortable in exam room  Back: No gross  deformity, scoliosis. TTP midline mid to lower lumbar spine but no stepoffs. Did not test ROM today. Strength LEs 5/5 all muscle groups.   2+ MSRs in patellar and achilles tendons, equal bilaterally. Negative SLRs. Sensation intact to light touch bilaterally. Negative logroll bilateral hips.   Assessment & Plan:  1. Low back pain - 2/2 L1 compression fracture.  Continue either diclofenac or aleve.  Icing as needed.  Back brace for support at work.  Encouraged calcium and vitamin D.  Percocet as needed for pain.  Topical medicines reviewed also.  F/u in about 3 weeks.  Consider physical therapy.  Will need bone density test in future.

## 2017-03-09 NOTE — Assessment & Plan Note (Signed)
2/2 L1 compression fracture.  Continue either diclofenac or aleve.  Icing as needed.  Back brace for support at work.  Encouraged calcium and vitamin D.  Percocet as needed for pain.  Topical medicines reviewed also.  F/u in about 3 weeks.  Consider physical therapy.  Will need bone density test in future.

## 2017-03-19 ENCOUNTER — Telehealth: Payer: Self-pay | Admitting: Family Medicine

## 2017-03-19 MED ORDER — HYDROCODONE-ACETAMINOPHEN 5-325 MG PO TABS: 1.0000 | ORAL_TABLET | Freq: Three times a day (TID) | ORAL | 0 refills | Status: DC | PRN

## 2017-03-19 NOTE — Telephone Encounter (Signed)
This will be the last narcotic prescription for her compression fracture.  If she is still having severe pain at her follow-up appointment where she feels she needs the medication, we are going to have to do imaging to assess if this is not healing right or if there's something going on in addition to the compression fracture.

## 2017-03-19 NOTE — Telephone Encounter (Signed)
Patient requesting a refill of pain medication for her back. Patient states she has been icing her back as instructed but her pain has not improved.

## 2017-03-20 MED FILL — HYDROCODON-APAP 5-325: 5-325 | 10 days supply | Qty: 30 | Fill #0

## 2017-03-20 NOTE — Telephone Encounter (Signed)
Patient came by and picked up script.

## 2017-03-30 ENCOUNTER — Encounter: Payer: Self-pay | Admitting: Family Medicine

## 2017-03-30 ENCOUNTER — Ambulatory Visit (HOSPITAL_BASED_OUTPATIENT_CLINIC_OR_DEPARTMENT_OTHER)
Admission: RE | Admit: 2017-03-30 | Discharge: 2017-03-30 | Disposition: A | Discharge status: Home / Self Care | Payer: BLUE CROSS/BLUE SHIELD | Source: Ambulatory Visit | Attending: Family Medicine | Admitting: Family Medicine

## 2017-03-30 ENCOUNTER — Ambulatory Visit (INDEPENDENT_AMBULATORY_CARE_PROVIDER_SITE_OTHER): Payer: BLUE CROSS/BLUE SHIELD | Admitting: Family Medicine

## 2017-03-30 VITALS — BP 139/89 | HR 78 | Ht 67.0 in | Wt 150.0 lb

## 2017-03-30 DIAGNOSIS — R29890 Loss of height: Secondary | ICD-10-CM | POA: Diagnosis not present

## 2017-03-30 DIAGNOSIS — M48061 Spinal stenosis, lumbar region without neurogenic claudication: Secondary | ICD-10-CM | POA: Diagnosis not present

## 2017-03-30 DIAGNOSIS — M5136 Other intervertebral disc degeneration, lumbar region: Secondary | ICD-10-CM | POA: Insufficient documentation

## 2017-03-30 DIAGNOSIS — M4856XA Collapsed vertebra, not elsewhere classified, lumbar region, initial encounter for fracture: Secondary | ICD-10-CM | POA: Diagnosis not present

## 2017-03-30 DIAGNOSIS — M545 Low back pain, unspecified: Secondary | ICD-10-CM

## 2017-03-30 NOTE — Patient Instructions (Signed)
You have a lumbar spine compression fracture. Take calcium 1300mg  daily, vitamin D 800 IU daily. Diclofenac 75mg  twice a day with food. Icing 15 minutes at a time 3-4 times a day. Back brace if this reduces pain but don't lift more because of this. Topical capsaicin or biofreeze up to 4 times a day for pain. We will go ahead with an MRI to assess for issues aside from the compression fracture and to assess for healing. I will call you with results and next steps.

## 2017-04-01 NOTE — Assessment & Plan Note (Signed)
2/2 L1 compression fracture though most pain and tenderness lower lumbar spine.  Independently reviewed radiographs and more compression noted which is typical but no posterior extrusion and history is reassuring.  Based on lack of improvement will go ahead with MRI to assess for concurrent injuries.  Diclofenac or aleve, back brace.  Ca and vit d.  Will call with results and next steps.  Will need bone density test in future.

## 2017-04-01 NOTE — Progress Notes (Addendum)
PCP: Patient, No Pcp Per  Subjective:   HPI: Patient is a 56 y.o. female here for low back pain.  6/29: Patient reports about 1 week ago she was moving heavy boxes at work when she felt a sharp pain in middle of low back. Seemed to be ok but the next day pain was severe. Pain now 4/10, up to 9/10 and sharp at worst. No radiation into legs. No numbness/tingling. No known osteoporosis - has not had bone density test however.  7/16: Patient reports she is improving. Pain at worst 4/10 now, can be sharp mid low back. No radiation into legs. No numbness or tingling. Worse pain with prolonged standing or sitting. Taking aleve or diclofenac. Takes a couple Microbiologist a day now. Brace has been helpful. No skin changes.  8/6: Patient reports she's still struggling with pain at about the same level - 4/10, sharp mid low back. No radiation into legs. Wearing back brace. Taking norco as needed. No numbness, tingling.  Past Medical History:  Diagnosis Date  . Chronic back pain   . Frequency of urination   . History of kidney stones   . History of sepsis    Admitted 07-11-2015  urosepsis  . Left ureteral stone   . Nephrolithiasis    right-- nonobstructive per ct 07-11-2015  . Urgency of urination     Current Outpatient Prescriptions on File Prior to Visit  Medication Sig Dispense Refill  . diclofenac (VOLTAREN) 75 MG EC tablet Take 1 tablet (75 mg total) by mouth 2 (two) times daily. 60 tablet 1  . HYDROcodone-acetaminophen (NORCO) 5-325 MG tablet Take 1 tablet by mouth every 8 (eight) hours as needed for moderate pain. 30 tablet 0   No current facility-administered medications on file prior to visit.     Past Surgical History:  Procedure Laterality Date  . CYSTOSCOPY W/ URETERAL STENT PLACEMENT Left 09/06/2015   Procedure: CYSTOSCOPY WITH STENT REPLACEMENT;  Surgeon: Marcine Matar, MD;  [facility];  Service: Urology;  Laterality: Left;  .  CYSTOSCOPY WITH STENT PLACEMENT Left 07/11/2015   Procedure: CYSTOSCOPY WITH STENT PLACEMENT LEFT RETROGRADE;  Surgeon: Marcine Matar, MD;  Location: [facility];  Service: Urology;  Laterality: Left;  . CYSTOSCOPY WITH URETEROSCOPY Left 09/06/2015   Procedure: CYSTOSCOPY WITH URETEROSCOPY;  Surgeon: Marcine Matar, MD;  Location: [facility];  Service: Urology;  Laterality: Left;  . SHOULDER ARTHROSCOPY W/ ROTATOR CUFF REPAIR Right x4  last one 2010   last one included labral debridement  . STONE EXTRACTION WITH BASKET Left 09/06/2015   Procedure: STONE EXTRACTION WITH BASKET;  Surgeon: Marcine Matar, MD;  Location: [facility];  Service: Urology;  Laterality: Left;  . URETEROLITHOTOMY  2007    No Known Allergies  Social History   Social History  . Marital status: Single    Spouse name: N/A  . Number of children: N/A  . Years of education: N/A   Occupational History  . Not on file.   Social History Main Topics  . Smoking status: Current Every Day Smoker    Packs/day: 0.75    Years: 40.00    Types: Cigarettes  . Smokeless tobacco: Never Used  . Alcohol use 0.0 oz/week     Comment: OCCASIONAL  . Drug use: No  . Sexual activity: Not on file   Other Topics Concern  . Not on file   Social History Narrative  . No narrative on file    No family  history on file.  BP 139/89   Pulse 78   Ht 5\' 7"  (1.702 m)   Wt 150 lb (68 kg)   BMI 23.49 kg/m   Review of Systems: See HPI above.     Objective:  Physical Exam:  Gen: NAD, comfortable in exam room  Back: No gross deformity, scoliosis. TTP midline lower lumbar spine but no stepoffs. FROM but pain with flexion and extension. Strength LEs 5/5 all muscle groups.   2+ MSRs in patellar and achilles tendons, equal bilaterally. Negative SLRs. Sensation intact to light touch bilaterally. Negative logroll bilateral hips.   Assessment & Plan:  1. Low back pain - 2/2 L1 compression  fracture though most pain and tenderness lower lumbar spine.  Independently reviewed radiographs and more compression noted which is typical but no posterior extrusion and history is reassuring.  Based on lack of improvement will go ahead with MRI to assess for concurrent injuries.  Diclofenac or aleve, back brace.  Ca and vit d.  Will call with results and next steps.  Will need bone density test in future.  Addendum:  MRI reviewed and discussed with patient.  Still with some edema in L1 vertebra as expected.  I don't expect any progression here at this point.  Degenerative changes below this without evidence of new herniation or nerve compression - will start physical therapy for deconditioning related to her compression fracture.  Home exercises daily as well.  F/u in about 5 weeks.

## 2017-04-02 NOTE — Addendum Note (Signed)
Addended by: Kathi SimpersWISE, Zacari Stiff F on: 04/02/2017 11:19 AM   Modules accepted: Orders

## 2017-04-14 ENCOUNTER — Ambulatory Visit
Admission: RE | Admit: 2017-04-14 | Discharge: 2017-04-14 | Disposition: A | Discharge status: Home / Self Care | Payer: BLUE CROSS/BLUE SHIELD | Source: Ambulatory Visit | Attending: Family Medicine | Admitting: Family Medicine

## 2017-04-14 DIAGNOSIS — M545 Low back pain, unspecified: Secondary | ICD-10-CM

## 2017-04-17 NOTE — Addendum Note (Signed)
Addended by: Kathi Simpers F on: 04/17/2017 08:11 AM   Modules accepted: Orders

## 2018-09-02 ENCOUNTER — Emergency Department (HOSPITAL_COMMUNITY): Payer: BLUE CROSS/BLUE SHIELD

## 2018-09-02 ENCOUNTER — Emergency Department (HOSPITAL_COMMUNITY)
Admission: EM | Admit: 2018-09-02 | Discharge: 2018-09-02 | Disposition: A | Discharge status: Home / Self Care | Payer: BLUE CROSS/BLUE SHIELD | Attending: Emergency Medicine | Admitting: Emergency Medicine

## 2018-09-02 ENCOUNTER — Encounter (HOSPITAL_COMMUNITY): Payer: Self-pay | Admitting: Emergency Medicine

## 2018-09-02 ENCOUNTER — Other Ambulatory Visit: Payer: Self-pay

## 2018-09-02 DIAGNOSIS — R1084 Generalized abdominal pain: Secondary | ICD-10-CM | POA: Diagnosis not present

## 2018-09-02 DIAGNOSIS — F17210 Nicotine dependence, cigarettes, uncomplicated: Secondary | ICD-10-CM | POA: Diagnosis not present

## 2018-09-02 DIAGNOSIS — R112 Nausea with vomiting, unspecified: Secondary | ICD-10-CM

## 2018-09-02 DIAGNOSIS — Z79899 Other long term (current) drug therapy: Secondary | ICD-10-CM | POA: Insufficient documentation

## 2018-09-02 DIAGNOSIS — R109 Unspecified abdominal pain: Secondary | ICD-10-CM | POA: Diagnosis present

## 2018-09-02 DIAGNOSIS — R197 Diarrhea, unspecified: Secondary | ICD-10-CM

## 2018-09-02 LAB — URINALYSIS, ROUTINE W REFLEX MICROSCOPIC
Bilirubin Urine: NEGATIVE
Glucose, UA: NEGATIVE mg/dL
KETONES UR: 80 mg/dL — AB
Leukocytes, UA: NEGATIVE
Nitrite: NEGATIVE
PH: 5 (ref 5.0–8.0)
Protein, ur: 30 mg/dL — AB
SPECIFIC GRAVITY, URINE: 1.025 (ref 1.005–1.030)

## 2018-09-02 LAB — CBC WITH DIFFERENTIAL/PLATELET
Abs Immature Granulocytes: 0.1 10*3/uL — ABNORMAL HIGH (ref 0.00–0.07)
Basophils Absolute: 0.1 10*3/uL (ref 0.0–0.1)
Basophils Relative: 1 %
EOS ABS: 0 10*3/uL (ref 0.0–0.5)
Eosinophils Relative: 0 %
HEMATOCRIT: 45.5 % (ref 36.0–46.0)
Hemoglobin: 15.2 g/dL — ABNORMAL HIGH (ref 12.0–15.0)
IMMATURE GRANULOCYTES: 1 %
LYMPHS ABS: 0.2 10*3/uL — AB (ref 0.7–4.0)
Lymphocytes Relative: 2 %
MCH: 32.3 pg (ref 26.0–34.0)
MCHC: 33.4 g/dL (ref 30.0–36.0)
MCV: 96.6 fL (ref 80.0–100.0)
MONO ABS: 0.4 10*3/uL (ref 0.1–1.0)
MONOS PCT: 4 %
NEUTROS PCT: 92 %
Neutro Abs: 10.3 10*3/uL — ABNORMAL HIGH (ref 1.7–7.7)
Platelets: 297 10*3/uL (ref 150–400)
RBC: 4.71 MIL/uL (ref 3.87–5.11)
RDW: 12.3 % (ref 11.5–15.5)
WBC: 11.1 10*3/uL — ABNORMAL HIGH (ref 4.0–10.5)
nRBC: 0 % (ref 0.0–0.2)

## 2018-09-02 LAB — COMPREHENSIVE METABOLIC PANEL
ALT: 28 U/L (ref 0–44)
AST: 37 U/L (ref 15–41)
Albumin: 5 g/dL (ref 3.5–5.0)
Alkaline Phosphatase: 55 U/L (ref 38–126)
Anion gap: 13 (ref 5–15)
BILIRUBIN TOTAL: 0.8 mg/dL (ref 0.3–1.2)
BUN: 26 mg/dL — AB (ref 6–20)
CO2: 22 mmol/L (ref 22–32)
CREATININE: 0.65 mg/dL (ref 0.44–1.00)
Calcium: 9.4 mg/dL (ref 8.9–10.3)
Chloride: 105 mmol/L (ref 98–111)
GFR calc Af Amer: >60 mL/min (ref >60)
GFR calc non Af Amer: >60 mL/min (ref >60)
Glucose, Bld: 119 mg/dL — ABNORMAL HIGH (ref 70–99)
POTASSIUM: 4 mmol/L (ref 3.5–5.1)
Sodium: 140 mmol/L (ref 135–145)
TOTAL PROTEIN: 8.1 g/dL (ref 6.5–8.1)

## 2018-09-02 LAB — LIPASE, BLOOD: LIPASE: 28 U/L (ref 11–51)

## 2018-09-02 MED ORDER — DICYCLOMINE HCL 20 MG PO TABS: 20.0000 mg | ORAL_TABLET | Freq: Two times a day (BID) | ORAL | 0 refills | Status: DC

## 2018-09-02 MED ORDER — ONDANSETRON 4 MG PO TBDP: 4.0000 mg | ORAL_TABLET | Freq: Three times a day (TID) | ORAL | 0 refills | Status: DC | PRN

## 2018-09-02 MED ORDER — SODIUM CHLORIDE 0.9 % IV BOLUS
1000.0000 mL | Freq: Once | INTRAVENOUS | Status: AC
  Administered 2018-09-02: 1000 mL via INTRAVENOUS

## 2018-09-02 MED ORDER — IOPAMIDOL (ISOVUE-300) INJECTION 61%
100.0000 mL | Freq: Once | INTRAVENOUS | Status: AC | PRN
  Administered 2018-09-02: 100 mL via INTRAVENOUS

## 2018-09-02 MED ORDER — MORPHINE SULFATE (PF) 4 MG/ML IV SOLN
4.0000 mg | Freq: Once | INTRAVENOUS | Status: AC
  Administered 2018-09-02: 4 mg via INTRAVENOUS
  Filled 2018-09-02: qty 1

## 2018-09-02 MED ORDER — KETOROLAC TROMETHAMINE 15 MG/ML IJ SOLN
15.0000 mg | Freq: Once | INTRAMUSCULAR | Status: AC
  Administered 2018-09-02: 15 mg via INTRAVENOUS
  Filled 2018-09-02: qty 1

## 2018-09-02 MED ORDER — IOPAMIDOL (ISOVUE-300) INJECTION 61%
INTRAVENOUS | Status: AC
  Filled 2018-09-02: qty 100

## 2018-09-02 MED ORDER — ONDANSETRON HCL 4 MG/2ML IJ SOLN
4.0000 mg | Freq: Once | INTRAMUSCULAR | Status: AC
  Administered 2018-09-02: 4 mg via INTRAVENOUS
  Filled 2018-09-02: qty 2

## 2018-09-02 NOTE — ED Notes (Signed)
Pt given graham crackers and ice water. Tolerating well.

## 2018-09-02 NOTE — Discharge Instructions (Addendum)
Your work-up has been reassuring in the emergency department today.  Unknown cause of your symptoms.  May be related to a gastro enteritis.  There is no signs of significant infection on your lab work and your CT imaging shows no reason for your pain.  I will treat you with some Zofran for nausea and vomiting at home.  You can take Bentyl for any abdominal cramping that she may have.  Imodium for any significant diarrhea.  Drink plenty of fluids to stay hydrated.  If you develop any worsening vomiting, worsening pain, fevers or worsening bloody stools return to the ED immediately.  Make sure you follow-up with your primary care doctor and may need to have a colonoscopy if indicated.

## 2018-09-02 NOTE — ED Notes (Signed)
Bed: WA06 Expected date:  Expected time:  Means of arrival:  Comments: EMS 57yo f vomiting/diarrhea

## 2018-09-02 NOTE — ED Triage Notes (Signed)
Pt arrived via [facility] from home. About 1230 on 09/02/2018 patient woke up very nauseous with diarrhea. Patient went to work at 0300 on 09/02/2018 and was found laying down on the ground with abdominal pain by coworkers who called EMS.  Complains of pain in lower back from abdomen. NPO since 09/01/2018 Hx of kidney stones

## 2018-09-02 NOTE — ED Provider Notes (Addendum)
[facility]-EMERGENCY DEPT Provider Note   CSN: [id number] Arrival date & time: 09/02/18  0725     History   Chief Complaint Chief Complaint  Patient presents with  . Abdominal Pain    HPI Terri Reyes is a 58 y.o. female.  HPI 58 year old female past medical history significant for nephrolithiasis, chronic back pain presents to the emergency department today for evaluation of abdominal pain and low back pain.  Patient states last night she had some vomiting and diarrhea.  She went to work this morning and had intense sharp belly pain that was intermittent.  She describes the pain at its worst a 10/10.  Radiates to her right flank.  Patient states that she currently is not having pain at this time however states that it will start here shortly.  Patient reports associated nausea and vomiting.  She reports some blood mixed in with her stool last night but denies any bloody stools this morning.  Denies any specific urinary symptoms.  No vaginal bleeding or discharge.  Denies fevers or chills.  She has not taken anything for symptoms prior to arrival.  Not associated with food.  She reports no history of abdominal surgeries.  No recent antibiotic use, chronic NSAID use or recent travel.  Unsure if this feels like her last kidney stone.  Pt denies any fever, chill, ha, vision changes, lightheadedness, dizziness, congestion, neck pain, cp, sob, urinary symptoms,  lower extremity paresthesias.  Past Medical History:  Diagnosis Date  . Chronic back pain   . Frequency of urination   . History of kidney stones   . History of sepsis    Admitted 07-11-2015  urosepsis  . Left ureteral stone   . Nephrolithiasis    right-- nonobstructive per ct 07-11-2015  . Urgency of urination     Patient Active Problem List   Diagnosis Date Noted  . Low back pain 09/26/2015  . Fracture of great toe, left, closed 09/06/2015  . Pyonephrosis 07/11/2015    Past Surgical History:    Procedure Laterality Date  . CYSTOSCOPY W/ URETERAL STENT PLACEMENT Left 09/06/2015   Procedure: CYSTOSCOPY WITH STENT REPLACEMENT;  Surgeon: Marcine MatarStephen Dahlstedt, MD;  [facility];  Service: Urology;  Laterality: Left;  . CYSTOSCOPY WITH STENT PLACEMENT Left 07/11/2015   Procedure: CYSTOSCOPY WITH STENT PLACEMENT LEFT RETROGRADE;  Surgeon: Marcine MatarStephen Dahlstedt, MD;  Location: [facility];  Service: Urology;  Laterality: Left;  . CYSTOSCOPY WITH URETEROSCOPY Left 09/06/2015   Procedure: CYSTOSCOPY WITH URETEROSCOPY;  Surgeon: Marcine MatarStephen Dahlstedt, MD;  [facility];  Service: Urology;  Laterality: Left;  . SHOULDER ARTHROSCOPY W/ ROTATOR CUFF REPAIR Right x4  last one 2010   last one included labral debridement  . STONE EXTRACTION WITH BASKET Left 09/06/2015   Procedure: STONE EXTRACTION WITH BASKET;  Surgeon: Marcine MatarStephen Dahlstedt, MD;  [facility] ;  Service: Urology;  Laterality: Left;  . URETEROLITHOTOMY  2007     OB History   No obstetric history on file.      Home Medications    Prior to Admission medications   Medication Sig Start Date End Date Taking? Authorizing Provider  diclofenac (VOLTAREN) 75 MG EC tablet Take 1 tablet (75 mg total) by mouth 2 (two) times daily. 02/20/17   Hudnall, Azucena FallenShane R, MD  HYDROcodone-acetaminophen (NORCO) 5-325 MG tablet Take 1 tablet by mouth every 8 (eight) hours as needed for moderate pain. 03/19/17   Lenda KelpHudnall, Shane R, MD  Family History No family history on file.  Social History Social History   Tobacco Use  . Smoking status: Current Every Day Smoker    Packs/day: 0.75    Years: 40.00    Pack years: 30.00    Types: Cigarettes  . Smokeless tobacco: Never Used  Substance Use Topics  . Alcohol use: Yes    Alcohol/week: 0.0 standard drinks    Comment: OCCASIONAL  . Drug use: No     Allergies   Patient has no known allergies.   Review of Systems Review of Systems   Constitutional: Negative for chills and fever.  HENT: Negative for congestion.   Eyes: Negative for discharge.  Respiratory: Negative for shortness of breath.   Cardiovascular: Negative for chest pain.  Gastrointestinal: Positive for abdominal pain, blood in stool, diarrhea, nausea and vomiting. Negative for constipation.  Genitourinary: Positive for flank pain. Negative for dysuria, genital sores, urgency and vaginal bleeding.  Musculoskeletal: Negative for myalgias.  Skin: Negative for color change.  Neurological: Negative for headaches.     Physical Exam Updated Vital Signs BP (!) 160/99 (BP Location: Left Arm)   Pulse 71   Temp (!) 97.5 F (36.4 C) (Oral)   Resp 16   SpO2 100%   Physical Exam Vitals signs and nursing note reviewed.  Constitutional:      General: She is not in acute distress.    Appearance: She is well-developed. She is not toxic-appearing.  HENT:     Head: Normocephalic and atraumatic.     Nose: Nose normal.  Eyes:     General:        Right eye: No discharge.        Left eye: No discharge.     Conjunctiva/sclera: Conjunctivae normal.     Pupils: Pupils are equal, round, and reactive to light.  Neck:     Musculoskeletal: Normal range of motion and neck supple.  Cardiovascular:     Rate and Rhythm: Normal rate and regular rhythm.     Heart sounds: Normal heart sounds.  Pulmonary:     Effort: Pulmonary effort is normal. No respiratory distress.     Breath sounds: Normal breath sounds.  Chest:     Chest wall: No tenderness.  Abdominal:     General: Bowel sounds are normal.     Palpations: Abdomen is soft.     Tenderness: There is generalized abdominal tenderness. There is right CVA tenderness. There is no left CVA tenderness, guarding or rebound. Negative signs include Murphy's sign and Rovsing's sign.  Musculoskeletal: Normal range of motion.        General: No tenderness.  Lymphadenopathy:     Cervical: No cervical adenopathy.  Skin:     General: Skin is warm and dry.     Capillary Refill: Capillary refill takes less than 2 seconds.  Neurological:     Mental Status: She is alert and oriented to person, place, and time.  Psychiatric:        Behavior: Behavior normal.        Thought Content: Thought content normal.        Judgment: Judgment normal.      ED Treatments / Results  Labs (all labs ordered are listed, but only abnormal results are displayed) Labs Reviewed  CBC WITH DIFFERENTIAL/PLATELET - Abnormal; Notable for the following components:      Result Value   WBC 11.1 (*)    Hemoglobin 15.2 (*)    Neutro Abs 10.3 (*)  Lymphs Abs 0.2 (*)    Abs Immature Granulocytes 0.10 (*)    All other components within normal limits  COMPREHENSIVE METABOLIC PANEL - Abnormal; Notable for the following components:   Glucose, Bld 119 (*)    BUN 26 (*)    All other components within normal limits  URINALYSIS, ROUTINE W REFLEX MICROSCOPIC - Abnormal; Notable for the following components:   Hgb urine dipstick SMALL (*)    Ketones, ur 80 (*)    Protein, ur 30 (*)    Bacteria, UA RARE (*)    All other components within normal limits  LIPASE, BLOOD    EKG None  Radiology Ct Abdomen Pelvis W Contrast  Result Date: 09/02/2018 CLINICAL DATA:  Acute generalized abdominal pain, nausea, and diarrhea beginning today. Nephrolithiasis. EXAM: CT ABDOMEN AND PELVIS WITH CONTRAST TECHNIQUE: Multidetector CT imaging of the abdomen and pelvis was performed using the standard protocol following bolus administration of intravenous contrast. CONTRAST:  100 mL ISOVUE-300 IOPAMIDOL (ISOVUE-300) INJECTION 61% COMPARISON:  Noncontrast CT [facility] 07/11/2015 [facility] FINDINGS: Lower Chest: No acute findings. Hepatobiliary: No hepatic masses identified. Gallbladder is unremarkable. Pancreas:  No mass or inflammatory changes. Spleen: Within normal limits in size and appearance. Adrenals/Urinary Tract: No masses identified. Small  benign-appearing cyst in upper pole of right kidney. No evidence of hydronephrosis. Unremarkable unopacified urinary bladder. Stomach/Bowel: No evidence of obstruction, inflammatory process or abnormal fluid collections. Normal appendix visualized. Mild diverticulosis involving the sigmoid colon, however there is no evidence of diverticulitis. Vascular/Lymphatic: No pathologically enlarged lymph nodes. No abdominal aortic aneurysm. Aortic atherosclerosis. Reproductive:  No mass or other significant abnormality. Other:  None. Musculoskeletal: No suspicious bone lesions identified. Wedge compression fracture of the L1 vertebral body is seen which appears old, but has occurred since previous study in 2016 IMPRESSION: No acute findings. No evidence of urolithiasis or hydronephrosis. Mild sigmoid diverticulosis, without radiographic evidence of diverticulitis. Electronically Signed   By: Myles RosenthalJohn  Stahl M.D.   On: 09/02/2018 09:44    Procedures Procedures (including critical care time)  Medications Ordered in ED Medications  sodium chloride 0.9 % bolus 1,000 mL (1,000 mLs Intravenous New Bag/Given 09/02/18 0750)  ondansetron (ZOFRAN) injection 4 mg (has no administration in time range)     Initial Impression / Assessment and Plan / ED Course  I have reviewed the triage vital signs and the nursing notes.  Pertinent labs & imaging results that were available during my care of the patient were reviewed by me and considered in my medical decision making (see chart for details).     Patient presents the ED for evaluation of nausea, vomiting, diarrhea and abdominal pain.  Patient is nontoxic, nonseptic appearing, in no apparent distress.  Patient does have generalized abdominal pain to palpation but no signs of peritonitis and has normal bowel sounds.  Does have some right-sided CVA tenderness.  Patient's pain and other symptoms adequately managed in emergency department.  Fluid bolus given.  Labs, imaging and  vitals reviewed.  Patient does not meet the SIRS or Sepsis criteria.  Labs show mild leukocytosis.  No significant electrolyte derangement.  Normal hemoglobin.  Mild elevation in BUN.  Normal liver enzymes and kidney function.  Lipase was normal.  UA shows ketones but no other signs of infection.  Suspect dehydration and patient given fluid bolus.  CT scan was reassuring and is as documented above.  On repeat exam patient does not have a surgical abdomin and there are no peritoneal signs.  No indication of appendicitis, bowel obstruction, bowel perforation, cholecystitis, diverticulitis, PID or ectopic pregnancy.  Patient has no signs of acute blood loss.  No ongoing gross hematochezia.  Suspect viral gastroenteritis.  No indications for antibiotics at this time.  Has no risk factors for C. difficile.  Will give Zofran and Bentyl for home use.  Encouraged hydration and Imodium if needed.  Patient discharged home with symptomatic treatment and given strict instructions for follow-up with their primary care physician.    Pt is hemodynamically stable, in NAD, & able to ambulate in the ED. Evaluation does not show pathology that would require ongoing emergent intervention or inpatient treatment. I explained the diagnosis to the patient. Pain has been managed & has no complaints prior to dc. Pt is comfortable with above plan and is stable for discharge at this time. All questions were answered prior to disposition. Strict return precautions for f/u to the ED were discussed. Encouraged follow up with PCP.      Final Clinical Impressions(s) / ED Diagnoses   Final diagnoses:  Generalized abdominal pain  Nausea vomiting and diarrhea    ED Discharge Orders         Ordered    ondansetron (ZOFRAN ODT) 4 MG disintegrating tablet  Every 8 hours PRN     09/02/18 1018    dicyclomine (BENTYL) 20 MG tablet  2 times daily     09/02/18 1018           Rise Mu, PA-C 09/02/18 1019    Rise Mu, PA-C 09/02/18 1020    Vanetta Mulders, MD 09/14/18 [phone]

## 2019-03-30 NOTE — Progress Notes (Signed)
Wt. 283 lbs.  Taking 500 mg of Metformin bid, some diarrhea.  Is scared to eat.  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  Discussed Prediabetes, strategies to prevent including exercise, weight loss, adequate sleep and healthy eating.  Explained Dietary Guidelines.  Provided 1400 calorie meal plan and book on CHO Counting.  Encouraged higher fiber foods, lean protein.  Suggested sample menus.   Pt will try to follow, keep food records, increase physical activity as able.  Contact information provided. F/U 04/27/19.

## 2019-04-27 NOTE — Progress Notes (Signed)
Wt. 278.4 lbs (283 lbs 03/28/19).  Avocado on whole grain or eggs and cantaloupe or oatmeal; pork, corn, carrots; salad with chicken, pork or egg. Having trouble with diarrhea on and off.  Not hungry.  Does not add salt and looks for lower sodium foods.  Drinks coffee, water, G-2.  Sleeps better with G-2 as does not get leg cramps.  Tries to walk 3 times per week.  Will increase if able.  [facility] in October.

## 2019-05-04 DIAGNOSIS — K2100 Gastro-esophageal reflux disease with esophagitis, without bleeding: Secondary | ICD-10-CM | POA: Insufficient documentation

## 2019-05-08 ENCOUNTER — Emergency Department (HOSPITAL_BASED_OUTPATIENT_CLINIC_OR_DEPARTMENT_OTHER)
Admission: EM | Admit: 2019-05-08 | Discharge: 2019-05-08 | Disposition: A | Discharge status: Home / Self Care | Payer: BC Managed Care – PPO | Attending: Emergency Medicine | Admitting: Emergency Medicine

## 2019-05-08 ENCOUNTER — Other Ambulatory Visit: Payer: Self-pay

## 2019-05-08 ENCOUNTER — Encounter (HOSPITAL_BASED_OUTPATIENT_CLINIC_OR_DEPARTMENT_OTHER): Payer: Self-pay | Admitting: Emergency Medicine

## 2019-05-08 ENCOUNTER — Emergency Department (HOSPITAL_BASED_OUTPATIENT_CLINIC_OR_DEPARTMENT_OTHER): Payer: BC Managed Care – PPO

## 2019-05-08 DIAGNOSIS — Y929 Unspecified place or not applicable: Secondary | ICD-10-CM | POA: Insufficient documentation

## 2019-05-08 DIAGNOSIS — Y29XXXA Contact with blunt object, undetermined intent, initial encounter: Secondary | ICD-10-CM | POA: Diagnosis not present

## 2019-05-08 DIAGNOSIS — F17210 Nicotine dependence, cigarettes, uncomplicated: Secondary | ICD-10-CM | POA: Diagnosis not present

## 2019-05-08 DIAGNOSIS — Y9301 Activity, walking, marching and hiking: Secondary | ICD-10-CM | POA: Insufficient documentation

## 2019-05-08 DIAGNOSIS — S92404A Nondisplaced unspecified fracture of right great toe, initial encounter for closed fracture: Secondary | ICD-10-CM

## 2019-05-08 DIAGNOSIS — S92425A Nondisplaced fracture of distal phalanx of left great toe, initial encounter for closed fracture: Secondary | ICD-10-CM | POA: Diagnosis not present

## 2019-05-08 DIAGNOSIS — S99922A Unspecified injury of left foot, initial encounter: Secondary | ICD-10-CM | POA: Diagnosis present

## 2019-05-08 DIAGNOSIS — Y999 Unspecified external cause status: Secondary | ICD-10-CM | POA: Diagnosis not present

## 2019-05-08 NOTE — ED Provider Notes (Signed)
[place] EMERGENCY DEPARTMENT Provider Note   CSN: [id number] Arrival date & time: 05/08/19  1020     History   Chief Complaint Chief Complaint  Patient presents with  . Toe Pain    HPI Terri Reyes is a 58 y.o. female.  She injured her left great toe yesterday striking it on a solid object while she was walking in her flip-flops.  She is complaining of moderate pain and swelling to that toe.  No other injuries or complaints.     The history is provided by the patient.  Toe Pain This is a new problem. The current episode started yesterday. The problem occurs constantly. The problem has not changed since onset.Pertinent negatives include no chest pain, no abdominal pain, no headaches and no shortness of breath. The symptoms are aggravated by walking. The symptoms are relieved by rest. She has tried rest for the symptoms. The treatment provided mild relief.    Past Medical History:  Diagnosis Date  . Chronic back pain   . Frequency of urination   . History of kidney stones   . History of sepsis    Admitted 07-11-2015  urosepsis  . Left ureteral stone   . Nephrolithiasis    right-- nonobstructive per ct 07-11-2015  . Urgency of urination     Patient Active Problem List   Diagnosis Date Noted  . Low back pain 09/26/2015  . Fracture of great toe, left, closed 09/06/2015  . Pyonephrosis 07/11/2015    Past Surgical History:  Procedure Laterality Date  . CYSTOSCOPY W/ URETERAL STENT PLACEMENT Left 09/06/2015   Procedure: CYSTOSCOPY WITH STENT REPLACEMENT;  Surgeon: Franchot Gallo, MD;  Location: [facility];  Service: Urology;  Laterality: Left;  . CYSTOSCOPY WITH STENT PLACEMENT Left 07/11/2015   Procedure: CYSTOSCOPY WITH STENT PLACEMENT LEFT RETROGRADE;  Surgeon: Franchot Gallo, MD;  Location: [facility];  Service: Urology;  Laterality: Left;  . CYSTOSCOPY WITH URETEROSCOPY Left 09/06/2015   Procedure: CYSTOSCOPY WITH URETEROSCOPY;  Surgeon:  Franchot Gallo, MD;  Location[facility];  Service: Urology;  Laterality: Left;  . SHOULDER ARTHROSCOPY W/ ROTATOR CUFF REPAIR Right x4  last one 2010   last one included labral debridement  . STONE EXTRACTION WITH BASKET Left 09/06/2015   Procedure: STONE EXTRACTION WITH BASKET;  Surgeon: Franchot Gallo, MD;  Location: [facility];  Service: Urology;  Laterality: Left;  . URETEROLITHOTOMY  2007     OB History   No obstetric history on file.      Home Medications    Prior to Admission medications   Medication Sig Start Date End Date Taking? Authorizing Provider  diclofenac (VOLTAREN) 75 MG EC tablet Take 1 tablet (75 mg total) by mouth 2 (two) times daily. Patient not taking: Reported on 09/02/2018 02/20/17   Dene Gentry, MD  dicyclomine (BENTYL) 20 MG tablet Take 1 tablet (20 mg total) by mouth 2 (two) times daily. 09/02/18   Doristine Devoid, PA-C  HYDROcodone-acetaminophen (NORCO) 5-325 MG tablet Take 1 tablet by mouth every 8 (eight) hours as needed for moderate pain. Patient not taking: Reported on 09/02/2018 03/19/17   Dene Gentry, MD  omeprazole (PRILOSEC) 20 MG capsule Take 40 mg by mouth daily.    [provider]  ondansetron (ZOFRAN ODT) 4 MG disintegrating tablet Take 1 tablet (4 mg total) by mouth every 8 (eight) hours as needed for nausea or vomiting. 09/02/18   Doristine Devoid, PA-C  Family History No family history on file.  Social History Social History   Tobacco Use  . Smoking status: Current Every Day Smoker    Packs/day: 0.75    Years: 40.00    Pack years: 30.00    Types: Cigarettes  . Smokeless tobacco: Never Used  Substance Use Topics  . Alcohol use: Yes    Alcohol/week: 0.0 standard drinks    Comment: OCCASIONAL  . Drug use: No     Allergies   Patient has no known allergies.   Review of Systems Review of Systems  Respiratory: Negative for shortness of breath.   Cardiovascular: Negative  for chest pain.  Gastrointestinal: Negative for abdominal pain.  Skin: Negative for wound.  Neurological: Negative for headaches.     Physical Exam Updated Vital Signs BP (!) 153/86 (BP Location: Right Arm)   Pulse 77   Temp 98 F (36.7 C) (Oral)   Resp 16   Ht 5\' 5"  (1.651 m)   Wt 68 kg   SpO2 97%   BMI 24.96 kg/m   Physical Exam Constitutional:      Appearance: She is well-developed.  HENT:     Head: Normocephalic and atraumatic.  Eyes:     Conjunctiva/sclera: Conjunctivae normal.  Neck:     Musculoskeletal: Neck supple.  Musculoskeletal:     Comments: Left great toe is tender and ecchymotic.  Nail plate in place and cap refill brisk.  Other digits unaffected.  Skin:    General: Skin is warm and dry.     Capillary Refill: Capillary refill takes less than 2 seconds.  Neurological:     General: No focal deficit present.     Mental Status: She is alert.     GCS: GCS eye subscore is 4. GCS verbal subscore is 5. GCS motor subscore is 6.      ED Treatments / Results  Labs (all labs ordered are listed, but only abnormal results are displayed) Labs Reviewed - No data to display  EKG None  Radiology Dg Toe Great Left  Result Date: 05/08/2019 CLINICAL DATA:  Acute pain and LEFT great toe following injury last night. Initial encounter. EXAM: LEFT GREAT TOE COMPARISON:  None. FINDINGS: A nondisplaced intra-articular fracture at the LATERAL base of the distal phalanx noted. No dislocation. No other acute bony abnormality noted. IMPRESSION: Nondisplaced fracture at the LATERAL base of the distal phalanx. Electronically Signed   By: Harmon PierJeffrey  Hu M.D.   On: 05/08/2019 10:54    Procedures Procedures (including critical care time)  Medications Ordered in ED Medications - No data to display   Initial Impression / Assessment and Plan / ED Course  I have reviewed the triage vital signs and the nursing notes.  Pertinent labs & imaging results that were available during my  care of the patient were reviewed by me and considered in my medical decision making (see chart for details).  Clinical Course as of May 08 1115  Sun May 08, 2019  1113 She has a fracture at the base of her distal phalanx great toe, x-ray reviewed by me.   [MB]    Clinical Course User Index [MB] Terrilee FilesButler, Michael C, MD        Final Clinical Impressions(s) / ED Diagnoses   Final diagnoses:  Closed nondisplaced fracture of phalanx of right great toe, unspecified phalanx, initial encounter    ED Discharge Orders    None       Terrilee FilesButler, Michael C, MD 05/08/19 1658

## 2019-05-08 NOTE — ED Triage Notes (Signed)
Pt injured L great toe on a marble slab several days ago.

## 2019-05-08 NOTE — Discharge Instructions (Addendum)
You were seen in the emergency department for evaluation of a toe injury.  Your x-ray showed that your left great toe was broken.  Take time to heal.  We are providing you with a hard shoe that sometimes helps with ambulation.  He can use Tylenol and ibuprofen for pain.  Return to the emergency department for any concerns.

## 2019-09-29 ENCOUNTER — Emergency Department (HOSPITAL_BASED_OUTPATIENT_CLINIC_OR_DEPARTMENT_OTHER)
Admission: EM | Admit: 2019-09-29 | Discharge: 2019-09-29 | Disposition: A | Discharge status: Home / Self Care | Payer: No Typology Code available for payment source | Attending: Emergency Medicine | Admitting: Emergency Medicine

## 2019-09-29 ENCOUNTER — Encounter (HOSPITAL_BASED_OUTPATIENT_CLINIC_OR_DEPARTMENT_OTHER): Payer: Self-pay | Admitting: Emergency Medicine

## 2019-09-29 ENCOUNTER — Other Ambulatory Visit: Payer: Self-pay

## 2019-09-29 ENCOUNTER — Emergency Department (HOSPITAL_BASED_OUTPATIENT_CLINIC_OR_DEPARTMENT_OTHER): Payer: No Typology Code available for payment source

## 2019-09-29 DIAGNOSIS — Y9301 Activity, walking, marching and hiking: Secondary | ICD-10-CM | POA: Insufficient documentation

## 2019-09-29 DIAGNOSIS — Z79899 Other long term (current) drug therapy: Secondary | ICD-10-CM | POA: Insufficient documentation

## 2019-09-29 DIAGNOSIS — S8002XA Contusion of left knee, initial encounter: Secondary | ICD-10-CM | POA: Diagnosis not present

## 2019-09-29 DIAGNOSIS — Y929 Unspecified place or not applicable: Secondary | ICD-10-CM | POA: Insufficient documentation

## 2019-09-29 DIAGNOSIS — F17210 Nicotine dependence, cigarettes, uncomplicated: Secondary | ICD-10-CM | POA: Insufficient documentation

## 2019-09-29 DIAGNOSIS — Y990 Civilian activity done for income or pay: Secondary | ICD-10-CM | POA: Insufficient documentation

## 2019-09-29 DIAGNOSIS — W01198A Fall on same level from slipping, tripping and stumbling with subsequent striking against other object, initial encounter: Secondary | ICD-10-CM | POA: Insufficient documentation

## 2019-09-29 DIAGNOSIS — W010XXA Fall on same level from slipping, tripping and stumbling without subsequent striking against object, initial encounter: Secondary | ICD-10-CM

## 2019-09-29 DIAGNOSIS — S8992XA Unspecified injury of left lower leg, initial encounter: Secondary | ICD-10-CM | POA: Diagnosis present

## 2019-09-29 MED ORDER — NAPROXEN 250 MG PO TABS
500.0000 mg | ORAL_TABLET | Freq: Once | ORAL | Status: AC
  Administered 2019-09-29: 500 mg via ORAL
  Filled 2019-09-29: qty 2

## 2019-09-29 MED ORDER — NAPROXEN 375 MG PO TABS: ORAL_TABLET | ORAL | 0 refills | Status: DC

## 2019-09-29 MED ORDER — HYDROCODONE-ACETAMINOPHEN 5-325 MG PO TABS: 1.0000 | ORAL_TABLET | Freq: Once | ORAL | Status: DC

## 2019-09-29 MED ORDER — HYDROCODONE-ACETAMINOPHEN 5-325 MG PO TABS: 1.0000 | ORAL_TABLET | ORAL | 0 refills | Status: DC | PRN

## 2019-09-29 NOTE — ED Notes (Signed)
ED Provider at bedside. 

## 2019-09-29 NOTE — ED Triage Notes (Signed)
Pt states she was at work tripped and fell  Pt states her left knee came down on a pallet jack  Pt is c/o left knee pain  No bruising noted Accident happened around 4am

## 2019-09-29 NOTE — ED Provider Notes (Signed)
[place] DEPT [facility] Provider Note: Georgena Spurling, MD, FACEP  CSN: [id number] MRN: [id number] ARRIVAL: 09/29/19 at [place]: [place]  Knee Injury   HISTORY OF PRESENT ILLNESS  09/29/19 5:21 AM Terri Reyes is a 59 y.o. female who tripped and fell at work about 4 AM today.  Her left knee came down on a pallet jack.  She is complaining of left knee pain which she rates as a 10 out of 10.  She describes it as aching and throbbing, worse with movement or palpation.  She denies other injury.  There is no associated deformity or ecchymosis.   Past Medical History:  Diagnosis Date  . Chronic back pain   . Frequency of urination   . History of kidney stones   . History of sepsis    Admitted 07-11-2015  urosepsis  . Left ureteral stone   . Nephrolithiasis    right-- nonobstructive per ct 07-11-2015  . Urgency of urination     Past Surgical History:  Procedure Laterality Date  . CYSTOSCOPY W/ URETERAL STENT PLACEMENT Left 09/06/2015   Procedure: CYSTOSCOPY WITH STENT REPLACEMENT;  Surgeon: Franchot Gallo, MD;  Location: [facility];  Service: Urology;  Laterality: Left;  . CYSTOSCOPY WITH STENT PLACEMENT Left 07/11/2015   Procedure: CYSTOSCOPY WITH STENT PLACEMENT LEFT RETROGRADE;  Surgeon: Franchot Gallo, MD;  Location: [facility];  Service: Urology;  Laterality: Left;  . CYSTOSCOPY WITH URETEROSCOPY Left 09/06/2015   Procedure: CYSTOSCOPY WITH URETEROSCOPY;  Surgeon: Franchot Gallo, MD;  Location: [facility];  Service: Urology;  Laterality: Left;  . SHOULDER ARTHROSCOPY W/ ROTATOR CUFF REPAIR Right x4  last one 2010   last one included labral debridement  . STONE EXTRACTION WITH BASKET Left 09/06/2015   Procedure: STONE EXTRACTION WITH BASKET;  Surgeon: Franchot Gallo, MD;  Location[facility];  Service: Urology;  Laterality: Left;  . URETEROLITHOTOMY  2007    Family History  Problem Relation Age of  Onset  . Cancer Brother     Social History   Tobacco Use  . Smoking status: Current Every Day Smoker    Packs/day: 0.75    Years: 40.00    Pack years: 30.00    Types: Cigarettes  . Smokeless tobacco: Never Used  Substance Use Topics  . Alcohol use: Yes    Alcohol/week: 0.0 standard drinks    Comment: OCCASIONAL  . Drug use: No    Prior to Admission medications   Medication Sig Start Date End Date Taking? Authorizing Provider  HYDROcodone-acetaminophen (NORCO) 5-325 MG tablet Take 1 tablet by mouth every 4 (four) hours as needed for severe pain. 09/29/19   Labella Zahradnik, MD  naproxen (NAPROSYN) 375 MG tablet Take 1 tablet twice daily as needed for pain. 09/29/19   Audrena Talaga, MD  omeprazole (PRILOSEC) 20 MG capsule Take 40 mg by mouth daily.    [provider]  dicyclomine (BENTYL) 20 MG tablet Take 1 tablet (20 mg total) by mouth 2 (two) times daily. 09/02/18 09/29/19  Doristine Devoid, PA-C    Allergies Patient has no known allergies.   REVIEW OF SYSTEMS  Negative except as noted here or in the History of Present Illness.   PHYSICAL EXAMINATION  Initial Vital Signs Blood pressure (!) 174/97, pulse 87, temperature (!) 97.5 F (36.4 C), temperature source Oral, resp. rate 16, height 5\' 8"  (1.727 m), weight 68 kg, SpO2 99 %.  Examination General: Well-developed, well-nourished female in  no acute distress; appearance consistent with age of record HENT: normocephalic; atraumatic Eyes: Normal appearance Neck: supple Heart: regular rate and rhythm Lungs: clear to auscultation bilaterally Abdomen: soft; nondistended; nontender; bowel sounds present Extremities: No deformity; tenderness and mild swelling of left tibial tuberosity with pain on attempted range of motion of left knee Neurologic: Awake, alert and oriented; motor function intact in all extremities and symmetric; no facial droop Skin: Warm and dry Psychiatric: Normal mood and affect   RESULTS  Summary  of this visit's results, reviewed and interpreted by myself:   EKG Interpretation  Date/Time:    Ventricular Rate:    PR Interval:    QRS Duration:   QT Interval:    QTC Calculation:   R Axis:     Text Interpretation:        Laboratory Studies: No results found for this or any previous visit (from the past 24 hour(s)). Imaging Studies: DG Knee Complete 4 Views Left  Result Date: 09/29/2019 CLINICAL DATA:  Tripped and fell at work. Left knee pain. EXAM: LEFT KNEE - COMPLETE 4+ VIEW COMPARISON:  None. FINDINGS: The joint spaces are maintained. No acute fracture. No significant degenerative changes or osteochondral lesion. No definite joint effusion. IMPRESSION: No acute bony findings. Electronically Signed   By: Rudie Meyer M.D.   On: 09/29/2019 05:36    ED COURSE and MDM  Nursing notes, initial and subsequent vitals signs, including pulse oximetry, reviewed and interpreted by myself.  Vitals:   09/29/19 0516 09/29/19 0517  BP: (!) 174/97   Pulse: 87   Resp: 16   Temp: (!) 97.5 F (36.4 C)   TempSrc: Oral   SpO2: 99%   Weight:  68 kg  Height:  5\' 8"  (1.727 m)   Medications  HYDROcodone-acetaminophen (NORCO/VICODIN) 5-325 MG per tablet 1 tablet (has no administration in time range)  naproxen (NAPROSYN) tablet 500 mg (500 mg Oral Given 09/29/19 0533)   Examination consistent with a contusion of the left tibial tuberosity.  No fractures are seen.  The patient states she has crutches at home.  We will provide a knee immobilizer for use as needed.   PROCEDURES  Procedures   ED DIAGNOSES     ICD-10-CM   1. Contusion of left knee, initial encounter  S80.02XA   2. Fall from slip, trip, or stumble, initial encounter  [id number], MD 09/29/19 [phone]

## 2019-10-04 ENCOUNTER — Ambulatory Visit (INDEPENDENT_AMBULATORY_CARE_PROVIDER_SITE_OTHER): Payer: BC Managed Care – PPO | Admitting: Family Medicine

## 2019-10-04 ENCOUNTER — Other Ambulatory Visit: Payer: Self-pay

## 2019-10-04 ENCOUNTER — Encounter: Payer: Self-pay | Admitting: Family Medicine

## 2019-10-04 ENCOUNTER — Ambulatory Visit: Payer: Self-pay

## 2019-10-04 VITALS — Ht 68.0 in | Wt 150.0 lb

## 2019-10-04 DIAGNOSIS — M23204 Derangement of unspecified medial meniscus due to old tear or injury, left knee: Secondary | ICD-10-CM | POA: Insufficient documentation

## 2019-10-04 DIAGNOSIS — M25562 Pain in left knee: Secondary | ICD-10-CM

## 2019-10-04 NOTE — Assessment & Plan Note (Signed)
Had a twisting mechanism while at work with subsequent pain.  Seems more likely to be related to acute changes of degenerative meniscus. -Hinged knee brace. -Provided samples of Duexis. -Counseled on home exercise therapy and supportive care. -If no improvement could consider injection and physical therapy.

## 2019-10-04 NOTE — Patient Instructions (Signed)
Nice to meet you Please try ice  Please try the exercises when pain has improved  Please let me know if you want more duexis   Please send me a message in MyChart with any questions or updates.  Please see me back in 4 weeks.   --Dr. Jordan Likes

## 2019-10-04 NOTE — Progress Notes (Signed)
Terri Reyes - 59 y.o. female MRN [id number]  Date of birth: 08-16-1961  SUBJECTIVE:  Including CC & ROS.  Chief Complaint  Patient presents with  . Knee Injury    left knee x 09/29/2019    Terri Reyes is a 59 y.o. female that is presenting with acute left knee pain.  She fell last Thursday while at work.  She fell in a twisting motion and landed on the left knee.  Since that time the pain is been more global in distribution.  Was placed in a knee immobilizer when seen in the emergency department.  Pain is been the same.  Has trouble with straightening the knee as well as flexion.  Independent review of the left knee x-ray from 2/4 shows no acute abnormality.   Review of Systems See HPI   HISTORY: Past Medical, Surgical, Social, and Family History Reviewed & Updated per EMR.   Pertinent Historical Findings include:  Past Medical History:  Diagnosis Date  . Chronic back pain   . Frequency of urination   . History of kidney stones   . History of sepsis    Admitted 07-11-2015  urosepsis  . Left ureteral stone   . Nephrolithiasis    right-- nonobstructive per ct 07-11-2015  . Urgency of urination     Past Surgical History:  Procedure Laterality Date  . CYSTOSCOPY W/ URETERAL STENT PLACEMENT Left 09/06/2015   Procedure: CYSTOSCOPY WITH STENT REPLACEMENT;  Surgeon: Marcine Matar, MD;  Location: [facility];  Service: Urology;  Laterality: Left;  . CYSTOSCOPY WITH STENT PLACEMENT Left 07/11/2015   Procedure: CYSTOSCOPY WITH STENT PLACEMENT LEFT RETROGRADE;  Surgeon: Marcine Matar, MD;  Location: [facility];  Service: Urology;  Laterality: Left;  . CYSTOSCOPY WITH URETEROSCOPY Left 09/06/2015   Procedure: CYSTOSCOPY WITH URETEROSCOPY;  Surgeon: Marcine Matar, MD;  Location: [facility];  Service: Urology;  Laterality: Left;  . SHOULDER ARTHROSCOPY W/ ROTATOR CUFF REPAIR Right x4  last one 2010   last one included labral debridement  . STONE  EXTRACTION WITH BASKET Left 09/06/2015   Procedure: STONE EXTRACTION WITH BASKET;  Surgeon: Marcine Matar, MD;  Location: [facility];  Service: Urology;  Laterality: Left;  . URETEROLITHOTOMY  2007    No Known Allergies  Family History  Problem Relation Age of Onset  . Cancer Brother      Social History   Socioeconomic History  . Marital status: Single    Spouse name: Not on file  . Number of children: Not on file  . Years of education: Not on file  . Highest education level: Not on file  Occupational History  . Not on file  Tobacco Use  . Smoking status: Current Every Day Smoker    Packs/day: 0.75    Years: 40.00    Pack years: 30.00    Types: Cigarettes  . Smokeless tobacco: Never Used  Substance and Sexual Activity  . Alcohol use: Yes    Alcohol/week: 0.0 standard drinks    Comment: OCCASIONAL  . Drug use: No  . Sexual activity: Not on file  Other Topics Concern  . Not on file  Social History Narrative  . Not on file   Social Determinants of Health   Financial Resource Strain:   . Difficulty of Paying Living Expenses: Not on file  Food Insecurity:   . Worried About Programme researcher, broadcasting/film/video in the Last Year: Not on file  . Ran Out of Food in the  Last Year: Not on file  Transportation Needs:   . Lack of Transportation (Medical): Not on file  . Lack of Transportation (Non-Medical): Not on file  Physical Activity:   . Days of Exercise per Week: Not on file  . Minutes of Exercise per Session: Not on file  Stress:   . Feeling of Stress : Not on file  Social Connections:   . Frequency of Communication with Friends and Family: Not on file  . Frequency of Social Gatherings with Friends and Family: Not on file  . Attends Religious Services: Not on file  . Active Member of Clubs or Organizations: Not on file  . Attends Archivist Meetings: Not on file  . Marital Status: Not on file  Intimate Partner Violence:   . Fear of Current or  Ex-Partner: Not on file  . Emotionally Abused: Not on file  . Physically Abused: Not on file  . Sexually Abused: Not on file     PHYSICAL EXAM:  VS: Ht 5\' 8"  (1.727 m)   Wt 150 lb (68 kg)   BMI 22.81 kg/m  Physical Exam Gen: NAD, alert, cooperative with exam, well-appearing ENT: normal lips, normal nasal mucosa,  Eye: normal EOM, normal conjunctiva and lids Skin: no rashes, no areas of induration  Neuro: normal tone, normal sensation to touch Psych:  normal insight, alert and oriented MSK:  Left knee: Mild effusion. Limited extension due to pain. Normal flexion. No instability. Positive McMurray's test. No pain with patellar grind. Neurovascularly intact   Limited ultrasound: Left knee:  Mild effusion in the suprapatellar pouch. Mild effusion near the insertion of the patellar tendon. Mild medial joint space narrowing.  Chronic degenerative changes of the meniscus in the medial compartment. Normal space and lateral joint compartment. Mild degenerative changes of the lateral meniscus. Small Baker's cyst  Summary: Findings consistent with meniscus irritation versus tear  Ultrasound and interpretation by Clearance Coots, MD    ASSESSMENT & PLAN:   Degenerative tear of medial meniscus of left knee Had a twisting mechanism while at work with subsequent pain.  Seems more likely to be related to acute changes of degenerative meniscus. -Hinged knee brace. -Provided samples of Duexis. -Counseled on home exercise therapy and supportive care. -If no improvement could consider injection and physical therapy.

## 2019-10-04 NOTE — Progress Notes (Signed)
Medication Samples have been provided to the patient.  Drug name: Duexis       Strength: 800mg /26.6mg         Qty: 2 Boxes  LOT  Exp.Date: 08/2020  Dosing instructions: Take 1 tablet by mouth three (3) times a day.  The patient has been instructed regarding the correct time, dose, and frequency of taking this medication, including desired effects and most common side effects.   09/2020, Kathi Simpers 9:32 AM 10/04/2019

## 2019-10-11 ENCOUNTER — Telehealth: Payer: Self-pay | Admitting: Family Medicine

## 2019-10-11 MED ORDER — IBUPROFEN-FAMOTIDINE 800-26.6 MG PO TABS: 1.0000 | ORAL_TABLET | Freq: Three times a day (TID) | ORAL | 3 refills | Status: DC

## 2019-10-11 NOTE — Telephone Encounter (Signed)
Sent duexis in.   Myra Rude, MD [facility] 10/11/2019, 4:39 PM

## 2019-10-11 NOTE — Telephone Encounter (Signed)
Patient requesting a Rx for Duexis

## 2019-11-01 ENCOUNTER — Ambulatory Visit: Payer: BC Managed Care – PPO | Admitting: Family Medicine

## 2020-06-23 ENCOUNTER — Ambulatory Visit: Payer: BC Managed Care – PPO

## 2021-01-24 DIAGNOSIS — M8588 Other specified disorders of bone density and structure, other site: Secondary | ICD-10-CM | POA: Diagnosis not present

## 2021-01-24 DIAGNOSIS — Z780 Asymptomatic menopausal state: Secondary | ICD-10-CM | POA: Diagnosis not present

## 2021-01-24 DIAGNOSIS — F17210 Nicotine dependence, cigarettes, uncomplicated: Secondary | ICD-10-CM | POA: Diagnosis not present

## 2021-01-24 DIAGNOSIS — Z122 Encounter for screening for malignant neoplasm of respiratory organs: Secondary | ICD-10-CM | POA: Diagnosis not present

## 2021-01-24 DIAGNOSIS — Z1231 Encounter for screening mammogram for malignant neoplasm of breast: Secondary | ICD-10-CM | POA: Diagnosis not present

## 2021-01-24 DIAGNOSIS — Z87891 Personal history of nicotine dependence: Secondary | ICD-10-CM | POA: Diagnosis not present

## 2021-01-24 DIAGNOSIS — M85852 Other specified disorders of bone density and structure, left thigh: Secondary | ICD-10-CM | POA: Diagnosis not present

## 2021-02-20 DIAGNOSIS — R922 Inconclusive mammogram: Secondary | ICD-10-CM | POA: Diagnosis not present

## 2021-02-20 DIAGNOSIS — R928 Other abnormal and inconclusive findings on diagnostic imaging of breast: Secondary | ICD-10-CM | POA: Diagnosis not present

## 2021-06-12 ENCOUNTER — Other Ambulatory Visit: Payer: Self-pay

## 2021-06-12 ENCOUNTER — Emergency Department (HOSPITAL_BASED_OUTPATIENT_CLINIC_OR_DEPARTMENT_OTHER)
Admission: EM | Admit: 2021-06-12 | Discharge: 2021-06-12 | Disposition: A | Discharge status: Home / Self Care | Payer: BC Managed Care – PPO | Attending: Emergency Medicine | Admitting: Emergency Medicine

## 2021-06-12 ENCOUNTER — Encounter (HOSPITAL_BASED_OUTPATIENT_CLINIC_OR_DEPARTMENT_OTHER): Payer: Self-pay

## 2021-06-12 ENCOUNTER — Emergency Department (HOSPITAL_BASED_OUTPATIENT_CLINIC_OR_DEPARTMENT_OTHER): Payer: BC Managed Care – PPO

## 2021-06-12 DIAGNOSIS — M11261 Other chondrocalcinosis, right knee: Secondary | ICD-10-CM | POA: Diagnosis not present

## 2021-06-12 DIAGNOSIS — Z5321 Procedure and treatment not carried out due to patient leaving prior to being seen by health care provider: Secondary | ICD-10-CM | POA: Diagnosis not present

## 2021-06-12 DIAGNOSIS — M25561 Pain in right knee: Secondary | ICD-10-CM | POA: Insufficient documentation

## 2021-06-12 MED ORDER — IBUPROFEN 400 MG PO TABS
400.0000 mg | ORAL_TABLET | Freq: Once | ORAL | Status: AC
  Administered 2021-06-12: 400 mg via ORAL

## 2021-06-12 MED ORDER — IBUPROFEN 400 MG PO TABS
ORAL_TABLET | ORAL | Status: AC
  Filled 2021-06-12: qty 1

## 2021-06-12 NOTE — ED Provider Notes (Signed)
Patient left without being seen  1. Patient left without being seen       Silva Bandy, PA-C 06/12/21 1229    Melene Plan, DO 06/12/21 1240

## 2021-06-12 NOTE — ED Triage Notes (Signed)
Pt was working overnight and started having right knee/upper shin pain. Pain also behind knee. Denies hx of blood clots. Pain worse with ambulation. Denies known injury

## 2021-12-23 ENCOUNTER — Ambulatory Visit
Admission: EM | Admit: 2021-12-23 | Discharge: 2021-12-23 | Disposition: A | Discharge status: Home / Self Care | Payer: BC Managed Care – PPO

## 2021-12-23 ENCOUNTER — Encounter: Payer: Self-pay | Admitting: Emergency Medicine

## 2021-12-23 ENCOUNTER — Ambulatory Visit (INDEPENDENT_AMBULATORY_CARE_PROVIDER_SITE_OTHER): Payer: BC Managed Care – PPO

## 2021-12-23 DIAGNOSIS — M19071 Primary osteoarthritis, right ankle and foot: Secondary | ICD-10-CM

## 2021-12-23 DIAGNOSIS — M79671 Pain in right foot: Secondary | ICD-10-CM

## 2021-12-23 DIAGNOSIS — S99921A Unspecified injury of right foot, initial encounter: Secondary | ICD-10-CM | POA: Diagnosis not present

## 2021-12-23 DIAGNOSIS — R2241 Localized swelling, mass and lump, right lower limb: Secondary | ICD-10-CM

## 2021-12-23 HISTORY — DX: Essential (primary) hypertension: I10

## 2021-12-23 NOTE — Discharge Instructions (Signed)
Your x-ray today was not concerning for any acute bony injury but severe degenerative changes were seen in all the bones across the top of your foot.  I would also like for you to keep in mind that sometimes x-rays do not initially reveal fractures if they are taken too soon after the initial injury. ? ?We have placed you in a stabilizing postop boot I would like for you to wear whenever you are walking.  You are welcome to continue using Aleve and Tylenol if these help.  I also recommend that you ice your foot and keep it elevated as much as possible for the next few days. ? ?By Wednesday or Thursday, if you have not seen any improvement of your pain or swelling and certainly if either have gotten worse, I strongly recommend that you follow-up with [facility] at the urgent care walk-in clinic.  You do not need to make an appointment before you go.  I provided you with contact information of the hours of the clinic. ? ?Thank you for visiting urgent care today.  I appreciate the opportunity to participate in your care. ?

## 2021-12-23 NOTE — ED Provider Notes (Signed)
?[facility] ? ? ? ?CSN: [id number] ?Arrival date & time: 12/23/21  1425 ?  ? ?HISTORY  ? ?Chief Complaint  ?Patient presents with  ? Foot Pain  ? ?HPI ?Terri Reyes is a 61 y.o. female. Patient presents to urgent care complaining of pain in her right foot along with swelling.  Patient states she stepped down on it "funny", felt "a sort of crunch" sensation and afterward it really started hurting.  Patient states she was walking with shoes that did not have a back on it.  Patient endorses progressively worsening pain.  Patient states she took some Aleve and Tylenol with no relief of her pain.  EMR reviewed, patient has a history of multiple complaints of bilateral knee and bilateral foot pain.  Imaging has been unremarkable to date.  Patient is a current everyday smoker, 3/4 pack/day. ? ?The history is provided by the patient.  ?Past Medical History:  ?Diagnosis Date  ? Chronic back pain   ? Frequency of urination   ? History of kidney stones   ? History of sepsis   ? Admitted 07-11-2015  urosepsis  ? Hypertension   ? Left ureteral stone   ? Nephrolithiasis   ? right-- nonobstructive per ct 07-11-2015  ? Urgency of urination   ? ?Patient Active Problem List  ? Diagnosis Date Noted  ? Degenerative tear of medial meniscus of left knee 10/04/2019  ? Low back pain 09/26/2015  ? Fracture of great toe, left, closed 09/06/2015  ? Pyonephrosis 07/11/2015  ? ?Past Surgical History:  ?Procedure Laterality Date  ? CYSTOSCOPY W/ URETERAL STENT PLACEMENT Left 09/06/2015  ? Procedure: CYSTOSCOPY WITH STENT REPLACEMENT;  Surgeon: Marcine Matar, MD;  Location[facility];  Service: Urology;  Laterality: Left;  ? CYSTOSCOPY WITH STENT PLACEMENT Left 07/11/2015  ? Procedure: CYSTOSCOPY WITH STENT PLACEMENT LEFT RETROGRADE;  Surgeon: Marcine Matar, MD;  Location: [facility];  Service: Urology;  Laterality: Left;  ? CYSTOSCOPY WITH URETEROSCOPY Left 09/06/2015  ? Procedure: CYSTOSCOPY WITH URETEROSCOPY;   Surgeon: Marcine Matar, MD;  Location: [facility];  Service: Urology;  Laterality: Left;  ? SHOULDER ARTHROSCOPY W/ ROTATOR CUFF REPAIR Right x4  last one 2010  ? last one included labral debridement  ? STONE EXTRACTION WITH BASKET Left 09/06/2015  ? Procedure: STONE EXTRACTION WITH BASKET;  Surgeon: Marcine Matar, MD;  Location: [facility];  Service: Urology;  Laterality: Left;  ? URETEROLITHOTOMY  2007  ? ?OB History   ?No obstetric history on file. ?  ? ?Home Medications   ? ?Prior to Admission medications   ?Medication Sig Start Date End Date Taking? Authorizing Provider  ?HYDROcodone-acetaminophen (NORCO) 5-325 MG tablet Take 1 tablet by mouth every 4 (four) hours as needed for severe pain. 09/29/19   Molpus, John, MD  ?Ibuprofen-Famotidine 800-26.6 MG TABS Take 1 tablet by mouth 3 (three) times daily. 10/11/19   Myra Rude, MD  ?lisinopril (ZESTRIL) 20 MG tablet Take 20 mg by mouth daily. 10/11/21   [provider]  ?naproxen (NAPROSYN) 375 MG tablet Take 1 tablet twice daily as needed for pain. 09/29/19   Molpus, John, MD  ?omeprazole (PRILOSEC) 20 MG capsule Take 40 mg by mouth daily.    [provider]  ?dicyclomine (BENTYL) 20 MG tablet Take 1 tablet (20 mg total) by mouth 2 (two) times daily. 09/02/18 09/29/19  Rise Mu, PA-C  ? ? ?Family History ?Family History  ?Problem Relation Age of Onset  ?  Cancer Brother   ? ?Social History ?Social History  ? ?Tobacco Use  ? Smoking status: Every Day  ?  Packs/day: 0.75  ?  Years: 40.00  ?  Pack years: 30.00  ?  Types: Cigarettes  ? Smokeless tobacco: Never  ?Vaping Use  ? Vaping Use: Never used  ?Substance Use Topics  ? Alcohol use: Yes  ?  Alcohol/week: 0.0 standard drinks  ?  Comment: OCCASIONAL  ? Drug use: No  ? ?Allergies   ?Patient has no known allergies. ? ?Review of Systems ?Review of Systems ?Pertinent findings noted in history of present illness.  ? ?Physical Exam ?Triage Vital Signs ?ED  Triage Vitals  ?Enc Vitals Group  ?   BP 06/21/21 0827 (!) 147/82  ?   Pulse Rate 06/21/21 0827 72  ?   Resp 06/21/21 0827 18  ?   Temp 06/21/21 0827 98.3 ?F (36.8 ?C)  ?   Temp Source 06/21/21 0827 Oral  ?   SpO2 06/21/21 0827 98 %  ?   Weight --   ?   Height --   ?   Head Circumference --   ?   Peak Flow --   ?   Pain Score 06/21/21 0826 5  ?   Pain Loc --   ?   Pain Edu? --   ?   Excl. in GC? --   ?No data found. ? ?Updated Vital Signs ?BP (!) 162/99 (BP Location: Left Arm)   Pulse 75   Temp 98 ?F (36.7 ?C) (Oral)   Resp 16   SpO2 95%  ? ?Physical Exam ? ?Visual Acuity ?Right Eye Distance:   ?Left Eye Distance:   ?Bilateral Distance:   ? ?Right Eye Near:   ?Left Eye Near:    ?Bilateral Near:    ? ?[facility] Couse / Diagnostics / Procedures:  ?  ?EKG ? ?Radiology ?DG Foot Complete Right ? ?Result Date: 12/23/2021 ?CLINICAL DATA:  Trauma, pain EXAM: RIGHT FOOT COMPLETE - 3+ VIEW COMPARISON:  None. FINDINGS: No recent fracture or dislocation is seen. Severe degenerative changes are noted in the second and third tarsometatarsal joints. Possibility of early Charcot joint disease is not excluded. Plantar spur is seen in calcaneus. There is soft tissue swelling over the dorsum. IMPRESSION: No recent fracture or dislocation is seen. Severe degenerative changes are noted in the second and third tarsometatarsal joints which may be due to osteoarthritis or early changes of Charcot's disease. Plantar spur is seen in the calcaneus. Electronically Signed   By: Ernie Avena M.D.   On: 12/23/2021 15:44   ? ?Procedures ?Procedures (including critical care time) ? ?UC Diagnoses / Final Clinical Impressions(s)   ?I have reviewed the triage vital signs and the nursing notes. ? ?Pertinent labs & imaging results that were available during my care of the patient were reviewed by me and considered in my medical decision making (see chart for details).   ? ?Final diagnoses:  ?Primary osteoarthritis of right foot  ?Right foot pain   ?Localized swelling of right foot  ? ?X-ray of foot revealed severe degenerative arthritis.  Patient was provided with a postop shoe to wear when walking.  Patient was advised to continue using Aleve and Tylenol as needed for pain and to keep it elevated is much as possible.  Patient advised to follow-up with orthopedics if her symptoms are not any better in the next 3 to 4 days. ? ?ED Prescriptions   ?None ?  ? ?PDMP  not reviewed this encounter. ? ?Pending results:  ?Labs Reviewed - No data to display ? ?Medications Ordered in UC: ?Medications - No data to display ? ?Disposition Upon Discharge:  ?Condition: stable for discharge home ?Home: take medications as prescribed; routine discharge instructions as discussed; follow up as advised. ? ?Patient presented with an acute illness with associated systemic symptoms and significant discomfort requiring urgent management. In my opinion, this is a condition that a prudent lay person (someone who possesses an average knowledge of health and medicine) may potentially expect to result in complications if not addressed urgently such as respiratory distress, impairment of bodily function or dysfunction of bodily organs.  ? ?Routine symptom specific, illness specific and/or disease specific instructions were discussed with the patient and/or caregiver at length.  ? ?As such, the patient has been evaluated and assessed, work-up was performed and treatment was provided in alignment with urgent care protocols and evidence based medicine.  Patient/parent/caregiver has been advised that the patient may require follow up for further testing and treatment if the symptoms continue in spite of treatment, as clinically indicated and appropriate. ? ?Patient/parent/caregiver has been advised to [facility] or [facility]CP if no better; to PCP or the Emergency Department if new signs and symptoms develop, or if the current signs or symptoms continue to change or worsen for further workup,  evaluation and treatment as clinically indicated and appropriate ? ?The patient will follow up with their current PCP if and as advised. If the patient does not currently have a PCP we will assist them in obtaining

## 2021-12-23 NOTE — ED Triage Notes (Signed)
Patient c/o  RT sided pain and foot swelling that started today.  ? ?Patient endorses " I was walking in shoes with no back and then I came down funny on it and then it really started hurting".  ? ?Patient endorses progressively worsening pain.  ? ?Patient has taken Aleve and Tylenol with no relief of symptoms.  ?

## 2022-12-08 ENCOUNTER — Encounter: Payer: Self-pay | Admitting: *Deleted

## 2022-12-11 ENCOUNTER — Ambulatory Visit
Admission: RE | Admit: 2022-12-11 | Discharge: 2022-12-11 | Disposition: A | Discharge status: Home / Self Care | Payer: BC Managed Care – PPO | Source: Ambulatory Visit | Attending: Urgent Care | Admitting: Urgent Care

## 2022-12-11 VITALS — BP 183/114 | HR 80 | Temp 98.1°F | Resp 20

## 2022-12-11 DIAGNOSIS — I10 Essential (primary) hypertension: Secondary | ICD-10-CM

## 2022-12-11 DIAGNOSIS — L409 Psoriasis, unspecified: Secondary | ICD-10-CM

## 2022-12-11 DIAGNOSIS — I160 Hypertensive urgency: Secondary | ICD-10-CM

## 2022-12-11 MED ORDER — BETAMETHASONE DIPROPIONATE 0.05 % EX OINT: TOPICAL_OINTMENT | Freq: Two times a day (BID) | CUTANEOUS | 1 refills | Status: DC

## 2022-12-11 MED ORDER — LOSARTAN POTASSIUM-HCTZ 50-12.5 MG PO TABS: 1.0000 | ORAL_TABLET | Freq: Every day | ORAL | 0 refills | Status: DC

## 2022-12-11 NOTE — ED Triage Notes (Addendum)
Pt reports rash to bilat shins x9 months. She has had intermittent relief during that time period, but states the itching will return after a few weeks. Minor relief with cortisone cream.

## 2022-12-11 NOTE — ED Provider Notes (Signed)
Terri Reyes  Note:  This document was prepared using Conservation officer, historic buildings and may include unintentional dictation errors.  MRN: [id number] DOB: May 05, 1961  Subjective:   Terri Reyes is a 62 y.o. female presenting for 1 year history of persistent intermittent rash on her lower anterior legs.  Reports that it is itchy and irritated, responds somewhat to cortisone cream over-the-counter.  Has not had an evaluation for this.  Has not seen her PCP in 2 years.  Has a history of hypertension but does not take any medications currently as she ran out.  Regarding her blood pressure, denies headache, confusion, chest pain, weakness, numbness or tingling, nausea, vomiting, abdominal pain, hematuria.  For that rash, no drainage of pus or bleeding.  She does have extensive history of smoking more than 30 pack years.  Smokes between 3/4-1ppd.  No history of heart disease, peripheral arterial disease, stroke.  No current facility-administered medications for this encounter.  Current Outpatient Medications:    lisinopril (ZESTRIL) 20 MG tablet, Take 20 mg by mouth daily., Disp: , Rfl:    No Known Allergies  Past Medical History:  Diagnosis Date   Chronic back pain    Frequency of urination    History of kidney stones    History of sepsis    Admitted 07-11-2015  urosepsis   Hypertension    Left ureteral stone    Nephrolithiasis    right-- nonobstructive per ct 07-11-2015   Urgency of urination      Past Surgical History:  Procedure Laterality Date   CYSTOSCOPY W/ URETERAL STENT PLACEMENT Left 09/06/2015   Procedure: CYSTOSCOPY WITH STENT REPLACEMENT;  Surgeon: Marcine Matar, MD;  Location: [facility];  Service: Urology;  Laterality: Left;   CYSTOSCOPY WITH STENT PLACEMENT Left 07/11/2015   Procedure: CYSTOSCOPY WITH STENT PLACEMENT LEFT RETROGRADE;  Surgeon: Marcine Matar, MD;  Location: [facility];  Service: Urology;  Laterality: Left;    CYSTOSCOPY WITH URETEROSCOPY Left 09/06/2015   Procedure: CYSTOSCOPY WITH URETEROSCOPY;  Surgeon: Marcine Matar, MD;  Location: [facility];  Service: Urology;  Laterality: Left;   SHOULDER ARTHROSCOPY W/ ROTATOR CUFF REPAIR Right x4  last one 2010   last one included labral debridement   STONE EXTRACTION WITH BASKET Left 09/06/2015   Procedure: STONE EXTRACTION WITH BASKET;  Surgeon: Marcine Matar, MD;  Location[facility];  Service: Urology;  Laterality: Left;   URETEROLITHOTOMY  2007    Family History  Problem Relation Age of Onset   Cancer Brother     Social History   Tobacco Use   Smoking status: Every Day    Packs/day: 0.75    Years: 40.00    Additional pack years: 0.00    Total pack years: 30.00    Types: Cigarettes   Smokeless tobacco: Never  Vaping Use   Vaping Use: Never used  Substance Use Topics   Alcohol use: Yes    Alcohol/week: 0.0 standard drinks of alcohol    Comment: OCCASIONAL   Drug use: No    ROS   Objective:   Vitals: BP (!) 183/114   Pulse 80   Temp 98.1 F (36.7 C) (Oral)   Resp 20   SpO2 98%   BP recheck was 177/94.  BP Readings from Last 3 Encounters:  12/11/22 (!) 183/114  12/23/21 (!) 162/99  06/12/21 (!) 155/102   Physical Exam Constitutional:      General: She is not in acute distress.  Appearance: Normal appearance. She is well-developed. She is not ill-appearing, toxic-appearing or diaphoretic.  HENT:     Head: Normocephalic and atraumatic.     Nose: Nose normal.     Mouth/Throat:     Mouth: Mucous membranes are moist.     Pharynx: No oropharyngeal exudate or posterior oropharyngeal erythema.  Eyes:     General: No scleral icterus.       Right eye: No discharge.        Left eye: No discharge.     Extraocular Movements: Extraocular movements intact.     Pupils: Pupils are equal, round, and reactive to light.  Cardiovascular:     Rate and Rhythm: Normal rate and regular  rhythm.     Heart sounds: Normal heart sounds. No murmur heard.    No friction rub. No gallop.  Pulmonary:     Effort: Pulmonary effort is normal. No respiratory distress.     Breath sounds: No stridor. No wheezing, rhonchi or rales.  Chest:     Chest wall: No tenderness.  Skin:    General: Skin is warm and dry.       Neurological:     General: No focal deficit present.     Mental Status: She is alert and oriented to person, place, and time.     Cranial Nerves: No cranial nerve deficit.     Motor: No weakness.     Coordination: Coordination normal.     Gait: Gait normal.     Comments: Negative Romberg and pronator drift. No facial asymmetry.   Psychiatric:        Mood and Affect: Mood normal.        Behavior: Behavior normal.        Thought Content: Thought content normal.        Judgment: Judgment normal.     Assessment and Plan :   PDMP not reviewed this encounter.  1. Psoriasis   2. Essential hypertension   3. Hypertensive urgency     High suspicion for psoriasis given overall presentation, recommended topical betamethasone ointment for this.  Discussed appropriate use of topical steroids.  Discussed possibility of PAD and encouraged patient to quit smoking.  Will restart blood pressure medication and losartan hydrochlorothiazide.  No signs of an acute encephalopathy.  Recommended follow-up with dermatology, referral placed.  Also establish care with a new PCP for recheck on her hypertension, general health maintenance. Counseled patient on potential for adverse effects with medications prescribed/recommended today, ER and return-to-clinic precautions discussed, patient verbalized understanding.    Terri Reyes, [place] 12/11/22 [phone]

## 2022-12-11 NOTE — Discharge Instructions (Addendum)
You can use the steroid ointment twice daily for 1 week. Apply thin layers. After 1 week of use you need to stop using this for another week before restarting.   For the blood pressure start taking losartan-HCTZ. Follow up with your previous PCP or one of the new ones I have recommended.   For diabetes or elevated blood sugar, please make sure you are limiting and avoiding starchy, carbohydrate foods like pasta, breads, sweet breads, pastry, rice, potatoes, desserts. These foods can elevate your blood sugar. Also, limit and avoid drinks that contain a lot of sugar such as sodas, sweet teas, fruit juices.  Drinking plain water will be much more helpful, try 64 ounces of water daily.  It is okay to flavor your water naturally by cutting cucumber, lemon, mint or lime, placing it in a picture with water and drinking it over a period of 24-48 hours as long as it remains refrigerated.  For elevated blood pressure, make sure you are monitoring salt in your diet.  Do not eat restaurant foods and limit processed foods at home. I highly recommend you prepare and cook your own foods at home.  Processed foods include things like frozen meals, pre-seasoned meats and dinners, deli meats, canned foods as these foods contain a high amount of sodium/salt.  Make sure you are paying attention to sodium labels on foods you buy at the grocery store. Buy your spices separately such as garlic powder, onion powder, cumin, cayenne, parsley flakes so that you can avoid seasonings that contain salt. However, salt-free seasonings are available and can be used, an example is Mrs. Dash and includes a lot of different mixtures that do not contain salt.  Lastly, when cooking using oils that are healthier for you is important. This includes olive oil, avocado oil, canola oil. We have discussed a lot of foods to avoid but below is a list of foods that can be very healthy to use in your diet whether it is for diabetes, cholesterol, high blood  pressure, or in general healthy eating.  Salads - kale, spinach, cabbage, spring mix, arugula Fruits - avocadoes, berries (blueberries, raspberries, blackberries), apples, oranges, pomegranate, grapefruit, kiwi Vegetables - asparagus, cauliflower, broccoli, green beans, brussel sprouts, bell peppers, beets; stay away from or limit starchy vegetables like potatoes, carrots, peas Other general foods - kidney beans, egg whites, almonds, walnuts, sunflower seeds, pumpkin seeds, fat free yogurt, almond milk, flax seeds, quinoa, oats  Meat - It is better to eat lean meats and limit your red meat including pork to once a week.  Wild caught fish, chicken breast are good options as they tend to be leaner sources of good protein. Still be mindful of the sodium labels for the meats you buy.  DO NOT EAT ANY FOODS ON THIS LIST THAT YOU ARE ALLERGIC TO. For more specific needs, I highly recommend consulting a dietician or nutritionist but this can definitely be a good starting point.

## 2022-12-17 ENCOUNTER — Ambulatory Visit (INDEPENDENT_AMBULATORY_CARE_PROVIDER_SITE_OTHER): Payer: BC Managed Care – PPO | Admitting: Family Medicine

## 2022-12-17 ENCOUNTER — Encounter: Payer: Self-pay | Admitting: Family Medicine

## 2022-12-17 VITALS — BP 130/76 | HR 85 | Temp 97.8°F | Ht 66.5 in | Wt 144.4 lb

## 2022-12-17 DIAGNOSIS — L409 Psoriasis, unspecified: Secondary | ICD-10-CM | POA: Diagnosis not present

## 2022-12-17 DIAGNOSIS — Z1159 Encounter for screening for other viral diseases: Secondary | ICD-10-CM

## 2022-12-17 DIAGNOSIS — Z23 Encounter for immunization: Secondary | ICD-10-CM

## 2022-12-17 DIAGNOSIS — Z1211 Encounter for screening for malignant neoplasm of colon: Secondary | ICD-10-CM

## 2022-12-17 DIAGNOSIS — Z13220 Encounter for screening for lipoid disorders: Secondary | ICD-10-CM

## 2022-12-17 DIAGNOSIS — Z1231 Encounter for screening mammogram for malignant neoplasm of breast: Secondary | ICD-10-CM

## 2022-12-17 DIAGNOSIS — Z720 Tobacco use: Secondary | ICD-10-CM | POA: Diagnosis not present

## 2022-12-17 DIAGNOSIS — I10 Essential (primary) hypertension: Secondary | ICD-10-CM | POA: Diagnosis not present

## 2022-12-17 DIAGNOSIS — Z87442 Personal history of urinary calculi: Secondary | ICD-10-CM | POA: Insufficient documentation

## 2022-12-17 MED ORDER — LOSARTAN POTASSIUM-HCTZ 50-12.5 MG PO TABS: 1.0000 | ORAL_TABLET | Freq: Every day | ORAL | 0 refills | Status: DC

## 2022-12-17 NOTE — Assessment & Plan Note (Signed)
Blood pressure is in good control. Continue Hyzaar 50-12.5 mg daily. I will check renal labs.

## 2022-12-17 NOTE — Assessment & Plan Note (Signed)
The exam today is consistent with psoriasis that has been partially treated with a steroid. We will continue the Diprolene daily as needed.

## 2022-12-17 NOTE — Progress Notes (Signed)
[facility] [address] [place] [address] Dept: [phone] Dept Fax: [phone]  New Patient Office Visit  Subjective:    Patient ID: Terri Reyes, female    DOB: 21-Feb-1961, 62 y.o..   MRN: [id number]  Chief Complaint  Patient presents with   Establish Care    NP-establish care.  C/o having elevated BP.    History of Present Illness:  Patient is in today to establish care. Terri Reyes was born in [place], [place]. She moved to [place] about 10 years ago. She is divorced. She has two children (42, 64) and one grandchild (78). 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 at [employer]. Terri Reyes smokes about 3/4 ppd. She previously had heavier tobacco use. She tried to quit in the past using a medication, but notes it caused her trouble, so she stopped this. She has ~ 5 drinks of alcohol a week. She denies drug use.  Terri Reyes has an extensive history of kidney stones. She has had multiple procedures for removal of stones.  Terri Reyes has a history of high blood pressure. She had been off of her medication for some time. At a recent ED visit, she was noted to have a BP of 183/114. She is back on medication now and doing better. Currently, she is on losartan-HCTZ 50-12.5 mg daily.  Terri Reyes has a history of a rash on her lower legs for the past year. At the ED, they felt this was psoriasis. She I snow on Diprolene ointment and has noticed considerable improvement.  Past Medical History: Patient Active Problem List   Diagnosis Date Noted   Essential hypertension 12/17/2022   Tobacco use 12/17/2022   Psoriasis 12/17/2022   History of kidney stones 12/17/2022   Low back pain 09/26/2015   Past Surgical History:  Procedure Laterality Date   CYSTOSCOPY W/ URETERAL STENT PLACEMENT Left 09/06/2015   Procedure: CYSTOSCOPY WITH STENT REPLACEMENT;  Surgeon: Marcine Matar, MD;  Location: [facility];  Service: Urology;  Laterality: Left;   CYSTOSCOPY WITH STENT PLACEMENT Left 07/11/2015   Procedure: CYSTOSCOPY WITH STENT PLACEMENT LEFT RETROGRADE;  Surgeon: Marcine Matar, MD;  Location[facility];  Service: Urology;  Laterality: Left;   CYSTOSCOPY WITH URETEROSCOPY Left 09/06/2015   Procedure: CYSTOSCOPY WITH URETEROSCOPY;  Surgeon: Marcine Matar, MD;  Location[facility];  Service: Urology;  Laterality: Left;   SHOULDER ARTHROSCOPY W/ ROTATOR CUFF REPAIR Right x4  last one 2010   last one included labral debridement   STONE EXTRACTION WITH BASKET Left 09/06/2015   Procedure: STONE EXTRACTION WITH BASKET;  Surgeon: Marcine Matar, MD;  Location: [facility];  Service: Urology;  Laterality: Left;   URETEROLITHOTOMY  2007   Family History  Problem Relation Age of Onset   Heart disease Father 37   Cancer Brother        Melanoma   Outpatient Medications Prior to Visit  Medication Sig Dispense Refill   betamethasone dipropionate (DIPROLENE) 0.05 % ointment Apply topically 2 (two) times daily. 50 g 1   losartan-hydrochlorothiazide (HYZAAR) 50-12.5 MG tablet Take 1 tablet by mouth daily. 90 tablet 0   No facility-administered medications prior to visit.   No Known Allergies Objective:   Today's Vitals   12/17/22 1335  BP: 130/76  Pulse: 85  Temp: 97.8 F (36.6 C)  TempSrc: Temporal  SpO2: 97%  Weight: 144 lb 6.4 oz (65.5 kg)  Height:  5' 6.5" (1.689 m)   Body mass index is 22.96 kg/m.   General: Well developed, well nourished. No acute distress. Skin: Warm and dry. There are irregular, reddened macules present on the lower legs in coalescing   patches. No current scale is noted. Psych: Alert and oriented. Normal mood and affect.  Health Maintenance Due  Topic Date Due   HIV Screening  Never done   Hepatitis C Screening  Never done   DTaP/Tdap/Td (1 - Tdap) Never done   PAP SMEAR-Modifier  Never done   COLONOSCOPY (Pts  45-51yrs Insurance coverage will need to be confirmed)  Never done   Lung Cancer Screening  Never done   MAMMOGRAM  Never done   Zoster Vaccines- Shingrix (1 of 2) Never done     Assessment & Plan:   Problem List Items Addressed This Visit       Cardiovascular and Mediastinum   Essential hypertension - Primary    Blood pressure is in good control. Continue Hyzaar 50-12.5 mg daily. I will check renal labs.      Relevant Medications   losartan-hydrochlorothiazide (HYZAAR) 50-12.5 MG tablet   Other Relevant Orders   Basic metabolic panel   Microalbumin / creatinine urine ratio     Musculoskeletal and Integument   Psoriasis    The exam today is consistent with psoriasis that has been partially treated with a steroid. We will continue the Diprolene daily as needed.        Other   Tobacco use    Recommend she continue efforts to stop smoking. We discussed lung cancer screening. I will place na order for this.      Relevant Orders   CT CHEST LUNG CANCER SCREENING LOW DOSE WO CONTRAST   Other Visit Diagnoses     Need for Tdap vaccination       Relevant Orders   Tdap vaccine greater than or equal to 7yo IM (Completed)   Screening for lipid disorders       Relevant Orders   Lipid panel   Encounter for hepatitis C screening test for low risk patient       Relevant Orders   HCV Ab w Reflex to Quant PCR   Screening for colon cancer       Relevant Orders   Ambulatory referral to Gastroenterology   Encounter for screening mammogram for malignant neoplasm of breast       Relevant Orders   MM DIGITAL SCREENING BILATERAL       Return in about 6 weeks (around 01/28/2023) for Reassessment and pap smear.   Loyola Mast, MD

## 2022-12-17 NOTE — Assessment & Plan Note (Signed)
Recommend she continue efforts to stop smoking. We discussed lung cancer screening. I will place na order for this.

## 2022-12-23 ENCOUNTER — Encounter: Payer: Self-pay | Admitting: Family Medicine

## 2022-12-23 ENCOUNTER — Encounter: Payer: Self-pay | Admitting: Gastroenterology

## 2022-12-23 ENCOUNTER — Other Ambulatory Visit (INDEPENDENT_AMBULATORY_CARE_PROVIDER_SITE_OTHER): Payer: BC Managed Care – PPO

## 2022-12-23 DIAGNOSIS — I10 Essential (primary) hypertension: Secondary | ICD-10-CM | POA: Diagnosis not present

## 2022-12-23 DIAGNOSIS — Z1159 Encounter for screening for other viral diseases: Secondary | ICD-10-CM

## 2022-12-23 DIAGNOSIS — E785 Hyperlipidemia, unspecified: Secondary | ICD-10-CM | POA: Insufficient documentation

## 2022-12-23 DIAGNOSIS — Z13220 Encounter for screening for lipoid disorders: Secondary | ICD-10-CM | POA: Diagnosis not present

## 2022-12-23 LAB — BASIC METABOLIC PANEL
BUN: 34 mg/dL — ABNORMAL HIGH (ref 6–23)
CO2: 24 mEq/L (ref 19–32)
Calcium: 9.7 mg/dL (ref 8.4–10.5)
Chloride: 100 mEq/L (ref 96–112)
Creatinine, Ser: 0.79 mg/dL (ref 0.40–1.20)
GFR: 79.83 mL/min (ref >60.00)
Glucose, Bld: 86 mg/dL (ref 70–99)
Potassium: 4.3 mEq/L (ref 3.5–5.1)
Sodium: 137 mEq/L (ref 135–145)

## 2022-12-23 LAB — MICROALBUMIN / CREATININE URINE RATIO
Creatinine,U: 148.2 mg/dL
Microalb Creat Ratio: 1 mg/g (ref 0.0–30.0)
Microalb, Ur: 1.4 mg/dL (ref 0.0–1.9)

## 2022-12-23 LAB — LIPID PANEL
Cholesterol: 266 mg/dL — ABNORMAL HIGH (ref 0–200)
HDL: 93 mg/dL (ref >39.00)
LDL Cholesterol: 162 mg/dL — ABNORMAL HIGH (ref 0–99)
NonHDL: 172.94
Total CHOL/HDL Ratio: 3
Triglycerides: 57 mg/dL (ref 0.0–149.0)
VLDL: 11.4 mg/dL (ref 0.0–40.0)

## 2022-12-25 LAB — HCV INTERPRETATION

## 2022-12-25 LAB — HCV AB W REFLEX TO QUANT PCR: HCV Ab: NONREACTIVE

## 2023-01-02 ENCOUNTER — Ambulatory Visit (AMBULATORY_SURGERY_CENTER): Payer: BC Managed Care – PPO | Admitting: *Deleted

## 2023-01-02 VITALS — Ht 66.5 in | Wt 150.0 lb

## 2023-01-02 DIAGNOSIS — Z1211 Encounter for screening for malignant neoplasm of colon: Secondary | ICD-10-CM

## 2023-01-02 MED ORDER — NA SULFATE-K SULFATE-MG SULF 17.5-3.13-1.6 GM/177ML PO SOLN
1.0000 | Freq: Once | ORAL | 0 refills | Status: AC
Start: 2023-01-02 — End: 2023-01-02

## 2023-01-02 NOTE — Progress Notes (Signed)
Pt's name and DOB verified at the beginning of the pre-visit.  Pt denies any difficulty with ambulating,sitting, laying down or rolling side to side Gave both [facility] main # and MD on call # prior to instructions.  No egg or soy allergy known to patient  No issues known to pt with past sedation with any surgeries or procedures Pt denies having issues being intubated Pt has no issues moving head neck or swallowing No FH of Malignant Hyperthermia Pt is not on diet pills Pt is not on home 02  Pt is not on blood thinners  Pt denies issues with constipation  Pt is not on dialysis Pt denise any abnormal heart rhythms  Pt denies any upcoming cardiac testing Pt encouraged to use to use Singlecare or Goodrx to reduce cost  Patient's chart reviewed by Cathlyn Parsons CNRA prior to pre-visit and patient appropriate for the [facility].  Pre-visit completed and red dot placed by patient's name on their procedure day (on provider's schedule).  . Visit by phone Pt states weight is 150lb Instructed pt why it is important to and  to call if they have any changes in health or new medications. Directed them to the # given and on instructions.   Pt states they will.  Instructions reviewed with pt and pt states understanding. Instructed to review again prior to procedure. Pt states they will.  Instructions sent by mail with coupon and by my chart

## 2023-01-26 ENCOUNTER — Inpatient Hospital Stay: Admission: RE | Admit: 2023-01-26 | Payer: BC Managed Care – PPO | Source: Ambulatory Visit

## 2023-01-28 ENCOUNTER — Other Ambulatory Visit (HOSPITAL_COMMUNITY)
Admission: RE | Admit: 2023-01-28 | Discharge: 2023-01-28 | Disposition: A | Discharge status: Home / Self Care | Payer: BC Managed Care – PPO | Source: Ambulatory Visit | Attending: Gastroenterology | Admitting: Gastroenterology

## 2023-01-28 ENCOUNTER — Encounter: Payer: Self-pay | Admitting: Family Medicine

## 2023-01-28 ENCOUNTER — Ambulatory Visit (INDEPENDENT_AMBULATORY_CARE_PROVIDER_SITE_OTHER): Payer: BC Managed Care – PPO | Admitting: Family Medicine

## 2023-01-28 VITALS — BP 118/70 | HR 79 | Temp 98.3°F | Ht 66.5 in | Wt 147.6 lb

## 2023-01-28 DIAGNOSIS — E782 Mixed hyperlipidemia: Secondary | ICD-10-CM

## 2023-01-28 DIAGNOSIS — I10 Essential (primary) hypertension: Secondary | ICD-10-CM | POA: Diagnosis not present

## 2023-01-28 DIAGNOSIS — Z124 Encounter for screening for malignant neoplasm of cervix: Secondary | ICD-10-CM | POA: Diagnosis not present

## 2023-01-28 MED ORDER — ROSUVASTATIN CALCIUM 5 MG PO TABS
5.0000 mg | ORAL_TABLET | Freq: Every day | ORAL | 3 refills | Status: DC
Start: 2023-01-28 — End: 2023-10-05

## 2023-01-28 NOTE — Assessment & Plan Note (Signed)
Blood pressure is in good control. Continue losartan-HCTZ (Hyzaar) 50-12.5 mg daily.  

## 2023-01-28 NOTE — Progress Notes (Signed)
[facility] [address] [place] [address] Dept: [phone] Dept Fax: [phone]  Chronic Care Office Visit  Subjective:    Patient ID: Terri Reyes, female    DOB: [birth year], 62 y.o..   MRN: [id number]  Chief Complaint  Patient presents with   Medical Management of Chronic Issues    6 week f/u and pap.     History of Present Illness:  Patient is in today for reassessment of chronic medical issues.  Ms. Hanisch has a history of high blood pressure. Currently, she is on losartan-HCTZ 50-12.5 mg daily. She is tolerating this well and pleased to see her BPs improved.  Ms. Manheim has a history of tobacco use. She recently had lipid screening that did show some hyperlipidemia.  Past Medical History: Patient Active Problem List   Diagnosis Date Noted   Hyperlipidemia 12/23/2022   Essential hypertension 12/17/2022   Tobacco use 12/17/2022   Psoriasis 12/17/2022   History of kidney stones 12/17/2022   Low back pain 09/26/2015   Past Surgical History:  Procedure Laterality Date   CYSTOSCOPY W/ URETERAL STENT PLACEMENT Left 09/06/2015   Procedure: CYSTOSCOPY WITH STENT REPLACEMENT;  Surgeon: Marcine Matar, MD;  Location: [facility];  Service: Urology;  Laterality: Left;   CYSTOSCOPY WITH STENT PLACEMENT Left 07/11/2015   Procedure: CYSTOSCOPY WITH STENT PLACEMENT LEFT RETROGRADE;  Surgeon: Marcine Matar, MD;  Location: [facility];  Service: Urology;  Laterality: Left;   CYSTOSCOPY WITH URETEROSCOPY Left 09/06/2015   Procedure: CYSTOSCOPY WITH URETEROSCOPY;  Surgeon: Marcine Matar, MD;  Location: [facility];  Service: Urology;  Laterality: Left;   SHOULDER ARTHROSCOPY W/ ROTATOR CUFF REPAIR Right x4  last one 2010   last one included labral debridement   STONE EXTRACTION WITH BASKET Left 09/06/2015   Procedure: STONE EXTRACTION WITH BASKET;  Surgeon: Marcine Matar, MD;   Location: [facility];  Service: Urology;  Laterality: Left;   URETEROLITHOTOMY  2007   Family History  Problem Relation Age of Onset   Heart disease Father 28   Cancer Brother        Melanoma   Colon polyps Neg Hx    Colon cancer Neg Hx    Esophageal cancer Neg Hx    Rectal cancer Neg Hx    Stomach cancer Neg Hx    Outpatient Medications Prior to Visit  Medication Sig Dispense Refill   betamethasone dipropionate (DIPROLENE) 0.05 % ointment Apply topically 2 (two) times daily. 50 g 1   losartan-hydrochlorothiazide (HYZAAR) 50-12.5 MG tablet Take 1 tablet by mouth daily. 90 tablet 0   Na Sulfate-K Sulfate-Mg Sulf 17.5-3.13-1.6 GM/177ML SOLN See admin instructions.     No facility-administered medications prior to visit.   No Known Allergies Objective:   Today's Vitals   01/28/23 0849  BP: 118/70  Pulse: 79  Temp: 98.3 F (36.8 C)  TempSrc: Temporal  SpO2: 98%  Weight: 147 lb 9.6 oz (67 kg)  Height: 5' 6.5" (1.689 m)   Body mass index is 23.47 kg/m.   General: Well developed, well nourished. No acute distress. GU: Normal external genitalia. Moderate redness around the urethra. Vaginal vault is pale and shiny   without discharge. Cervix is pink and appears normal. No CMT. Uterus normal size. No adnexal masses.   No anterior or posterior wall prolapse. Psych: Alert and oriented. Normal mood and affect.  Health Maintenance Due  Topic Date Due   HIV Screening  Never done  PAP SMEAR-Modifier  Never done   Colonoscopy  Never done   Lung Cancer Screening  Never done   MAMMOGRAM  Never done   Lab Results    Latest Ref Rng & Units 12/23/2022    8:23 AM 09/02/2018    7:48 AM 09/06/2015    6:56 AM  CMP  Glucose 70 - 99 mg/dL 86  063  74   BUN 6 - 23 mg/dL 34  26  19   Creatinine 0.40 - 1.20 mg/dL 0.16  0.10  9.32   Sodium 135 - 145 mEq/L 137  140  142   Potassium 3.5 - 5.1 mEq/L 4.3  4.0  3.9   Chloride 96 - 112 mEq/L 100  105  104   CO2 19 - 32 mEq/L  24  22    Calcium 8.4 - 10.5 mg/dL 9.7  9.4    Total Protein 6.5 - 8.1 g/dL  8.1    Total Bilirubin 0.3 - 1.2 mg/dL  0.8    Alkaline Phos 38 - 126 U/L  55    AST 15 - 41 U/L  37    ALT 0 - 44 U/L  28     Last lipids Lab Results  Component Value Date   CHOL 266 (H) 12/23/2022   HDL 93.00 12/23/2022   LDLCALC 162 (H) 12/23/2022   TRIG 57.0 12/23/2022   CHOLHDL 3 12/23/2022    Assessment & Plan:   Problem List Items Addressed This Visit       Cardiovascular and Mediastinum   Essential hypertension - Primary    Blood pressure is in good control. Continue losartan-HCTZ (Hyzaar) 50-12.5 mg daily      Relevant Medications   rosuvastatin (CRESTOR) 5 MG tablet     Other   Hyperlipidemia    ACC/AHA 10-year CV risk is 10.8% (intermediate risk). We discuss approaches to lower risk, including eating a heart-healthy diet, getting 150 minutes of moderate-intensity exercise each week, maintaining a normal weight, and avoiding tobacco products. In light of her risk and tobacco use, she would like to start a statin to lower her risk.       Relevant Medications   rosuvastatin (CRESTOR) 5 MG tablet   Other Visit Diagnoses     Screening for cervical cancer       Relevant Orders   Cytology - PAP      Return in about 3 months (around 04/30/2023) for Reassessment, fasting labs.   Loyola Mast, MD

## 2023-01-28 NOTE — Assessment & Plan Note (Signed)
ACC/AHA 10-year CV risk is 10.8% (intermediate risk). We discuss approaches to lower risk, including eating a heart-healthy diet, getting 150 minutes of moderate-intensity exercise each week, maintaining a normal weight, and avoiding tobacco products. In light of her risk and tobacco use, she would like to start a statin to lower her risk.

## 2023-01-30 ENCOUNTER — Encounter: Payer: Self-pay | Admitting: Gastroenterology

## 2023-02-02 ENCOUNTER — Ambulatory Visit
Admission: RE | Admit: 2023-02-02 | Discharge: 2023-02-02 | Disposition: A | Discharge status: Home / Self Care | Payer: BC Managed Care – PPO | Source: Ambulatory Visit | Attending: Family Medicine | Admitting: Family Medicine

## 2023-02-02 DIAGNOSIS — Z1231 Encounter for screening mammogram for malignant neoplasm of breast: Secondary | ICD-10-CM | POA: Diagnosis not present

## 2023-02-02 LAB — CYTOLOGY - PAP
Comment: NEGATIVE
Diagnosis: NEGATIVE
High risk HPV: NEGATIVE

## 2023-02-04 ENCOUNTER — Other Ambulatory Visit: Payer: Self-pay | Admitting: Family Medicine

## 2023-02-04 DIAGNOSIS — R928 Other abnormal and inconclusive findings on diagnostic imaging of breast: Secondary | ICD-10-CM

## 2023-02-11 ENCOUNTER — Encounter: Payer: Self-pay | Admitting: Gastroenterology

## 2023-02-11 ENCOUNTER — Ambulatory Visit (AMBULATORY_SURGERY_CENTER): Payer: BC Managed Care – PPO | Admitting: Gastroenterology

## 2023-02-11 VITALS — BP 116/56 | HR 51 | Temp 97.1°F | Resp 15 | Ht 66.5 in | Wt 150.0 lb

## 2023-02-11 DIAGNOSIS — Z1211 Encounter for screening for malignant neoplasm of colon: Secondary | ICD-10-CM | POA: Diagnosis not present

## 2023-02-11 DIAGNOSIS — K635 Polyp of colon: Secondary | ICD-10-CM | POA: Diagnosis not present

## 2023-02-11 DIAGNOSIS — D124 Benign neoplasm of descending colon: Secondary | ICD-10-CM | POA: Diagnosis not present

## 2023-02-11 DIAGNOSIS — D120 Benign neoplasm of cecum: Secondary | ICD-10-CM | POA: Diagnosis not present

## 2023-02-11 DIAGNOSIS — K5790 Diverticulosis of intestine, part unspecified, without perforation or abscess without bleeding: Secondary | ICD-10-CM

## 2023-02-11 MED ORDER — SODIUM CHLORIDE 0.9 % IV SOLN
500.0000 mL | INTRAVENOUS | Status: DC
Start: 2023-02-11 — End: 2023-02-11

## 2023-02-11 NOTE — Progress Notes (Signed)
See 01/28/2023 H&P, no changes

## 2023-02-11 NOTE — Patient Instructions (Addendum)
High fiber diet. Continue present medications. Await pathology results.  Please read over handouts about polyps, diverticulosis, hemorrhoids and high fiber diets   YOU HAD AN ENDOSCOPIC PROCEDURE TODAY AT THE [facility]:   Refer to the procedure report that was given to you for any specific questions about what was found during the examination.  If the procedure report does not answer your questions, please call your gastroenterologist to clarify.  If you requested that your care partner not be given the details of your procedure findings, then the procedure report has been included in a sealed envelope for you to review at your convenience later.  YOU SHOULD EXPECT: Some feelings of bloating in the abdomen. Passage of more gas than usual.  Walking can help get rid of the air that was put into your GI tract during the procedure and reduce the bloating. If you had a lower endoscopy (such as a colonoscopy or flexible sigmoidoscopy) you may notice spotting of blood in your stool or on the toilet paper. If you underwent a bowel prep for your procedure, you may not have a normal bowel movement for a few days.  Please Note:  You might notice some irritation and congestion in your nose or some drainage.  This is from the oxygen used during your procedure.  There is no need for concern and it should clear up in a day or so.  SYMPTOMS TO REPORT IMMEDIATELY:  Following lower endoscopy (colonoscopy or flexible sigmoidoscopy):  Excessive amounts of blood in the stool  Significant tenderness or worsening of abdominal pains  Swelling of the abdomen that is new, acute  Fever of 100F or higher  For urgent or emergent issues, a gastroenterologist can be reached at any hour by calling [phone]. Do not use MyChart messaging for urgent concerns.    DIET:  We do recommend a small meal at first, but then you may proceed to your regular diet.  Drink plenty of fluids but you should avoid  alcoholic beverages for 24 hours.  ACTIVITY:  You should plan to take it easy for the rest of today and you should NOT DRIVE or use heavy machinery until tomorrow (because of the sedation medicines used during the test).    FOLLOW UP: Our staff will call the number listed on your records the next business day following your procedure.  We will call around 7:15- 8:00 am to check on you and address any questions or concerns that you may have regarding the information given to you following your procedure. If we do not reach you, we will leave a message.     If any biopsies were taken you will be contacted by phone or by letter within the next 1-3 weeks.  Please call us at [phone] if you have not heard about the biopsies in 3 weeks.    SIGNATURES/CONFIDENTIALITY: You and/or your care partner have signed paperwork which will be entered into your electronic medical record.  These signatures attest to the fact that that the information above on your After Visit Summary has been reviewed and is understood.  Full responsibility of the confidentiality of this discharge information lies with you and/or your care-partner.

## 2023-02-11 NOTE — Progress Notes (Signed)
Patient states there have been no changes to medical or surgical history since time of pre-visit. 

## 2023-02-11 NOTE — Progress Notes (Signed)
Uneventful anesthetic. Report to pacu rn. Vss. Care resumed by rn. 

## 2023-02-11 NOTE — Op Note (Signed)
[facility] Patient Name: Terri Reyes Procedure Date: 02/11/2023 7:01 AM MRN: [id number] Endoscopist: Meryl Dare , MD, [phone] Age: 62 Referring MD:  Date of Birth: 30-Dec-1960 Gender: Female Account #: [id number] Procedure:                Colonoscopy Indications:              Screening for colorectal malignant neoplasm Medicines:                Monitored Anesthesia Care Procedure:                Pre-Anesthesia Assessment:                           - Prior to the procedure, a History and Physical                            was performed, and patient medications and                            allergies were reviewed. The patient's tolerance of                            previous anesthesia was also reviewed. The risks                            and benefits of the procedure and the sedation                            options and risks were discussed with the patient.                            All questions were answered, and informed consent                            was obtained. Prior Anticoagulants: The patient has                            taken no anticoagulant or antiplatelet agents. ASA                            Grade Assessment: II - A patient with mild systemic                            disease. After reviewing the risks and benefits,                            the patient was deemed in satisfactory condition to                            undergo the procedure.                           After obtaining informed consent, the colonoscope  was passed under direct vision. Throughout the                            procedure, the patient's blood pressure, pulse, and                            oxygen saturations were monitored continuously. The                            Olympus CF-HQ190L [id number]) Colonoscope was                            introduced through the anus and advanced to the the                            cecum,  identified by appendiceal orifice and                            ileocecal valve. The ileocecal valve, appendiceal                            orifice, and rectum were photographed. The quality                            of the bowel preparation was good. The colonoscopy                            was performed without difficulty. The patient                            tolerated the procedure well. Scope In: 7:58:34 AM Scope Out: 8:13:44 AM Scope Withdrawal Time: 0 hours 10 minutes 7 seconds  Total Procedure Duration: 0 hours 15 minutes 10 seconds  Findings:                 The perianal and digital rectal examinations were                            normal.                           Two sessile polyps were found in the descending                            colon and cecum. The polyps were 6 to 7 mm in size.                            These polyps were removed with a cold snare.                            Resection and retrieval were complete.                           Multiple medium-mouthed and small-mouthed  diverticula were found in the left colon. There was                            evidence of diverticular spasm. There was no                            evidence of diverticular bleeding.                           Internal hemorrhoids were found during                            retroflexion. The hemorrhoids were small and Grade                            I (internal hemorrhoids that do not prolapse).                           The exam was otherwise without abnormality on                            direct and retroflexion views. Complications:            No immediate complications. Estimated blood loss:                            None. Estimated Blood Loss:     Estimated blood loss: none. Impression:               - Two 6 to 7 mm polyps in the descending colon and                            in the cecum. These polyps were removed with a cold                             snare. Resection and retrieval were complete.                           - Moderate diverticulosis in the left colon.                           - Internal hemorrhoids.                           - The examination was otherwise normal on direct                            and retroflexion views. Recommendation:           - Repeat colonoscopy after studies are complete for                            surveillance based on pathology results.                           - Patient has a contact  number available for                            emergencies. The signs and symptoms of potential                            delayed complications were discussed with the                            patient. Return to normal activities tomorrow.                            Written discharge instructions were provided to the                            patient.                           - High fiber diet.                           - Continue present medications.                           - Await pathology results. Meryl Dare, MD 02/11/2023 8:17:49 AM This report has been signed electronically.

## 2023-02-11 NOTE — Progress Notes (Signed)
Called to room to assist during endoscopic procedure.  Patient ID and intended procedure confirmed with present staff. Received instructions for my participation in the procedure from the performing physician.  

## 2023-02-12 ENCOUNTER — Telehealth: Payer: Self-pay

## 2023-02-12 NOTE — Telephone Encounter (Signed)
Follow up call to pt, lm for pt to call if having any difficulty with normal activities or eating and drinking.  Also to call if any other questions or concerns.  

## 2023-02-19 ENCOUNTER — Encounter: Payer: Self-pay | Admitting: Gastroenterology

## 2023-03-04 ENCOUNTER — Ambulatory Visit
Admission: RE | Admit: 2023-03-04 | Discharge: 2023-03-04 | Disposition: A | Discharge status: Home / Self Care | Payer: BC Managed Care – PPO | Source: Ambulatory Visit | Attending: Family Medicine | Admitting: Family Medicine

## 2023-03-04 ENCOUNTER — Encounter: Payer: Self-pay | Admitting: Emergency Medicine

## 2023-03-04 ENCOUNTER — Ambulatory Visit
Admission: EM | Admit: 2023-03-04 | Discharge: 2023-03-04 | Disposition: A | Discharge status: Home / Self Care | Payer: BC Managed Care – PPO | Attending: Internal Medicine | Admitting: Internal Medicine

## 2023-03-04 DIAGNOSIS — R112 Nausea with vomiting, unspecified: Secondary | ICD-10-CM | POA: Diagnosis not present

## 2023-03-04 DIAGNOSIS — M62830 Muscle spasm of back: Secondary | ICD-10-CM

## 2023-03-04 DIAGNOSIS — Z720 Tobacco use: Secondary | ICD-10-CM

## 2023-03-04 DIAGNOSIS — F17210 Nicotine dependence, cigarettes, uncomplicated: Secondary | ICD-10-CM | POA: Diagnosis not present

## 2023-03-04 MED ORDER — ONDANSETRON 4 MG PO TBDP
4.0000 mg | ORAL_TABLET | Freq: Once | ORAL | Status: AC
  Administered 2023-03-04: 4 mg via ORAL

## 2023-03-04 MED ORDER — TIZANIDINE HCL 4 MG PO TABS: 4.0000 mg | ORAL_TABLET | Freq: Three times a day (TID) | ORAL | 0 refills | Status: AC | PRN

## 2023-03-04 MED ORDER — ONDANSETRON 4 MG PO TBDP: 4.0000 mg | ORAL_TABLET | Freq: Three times a day (TID) | ORAL | 0 refills | Status: AC | PRN

## 2023-03-04 MED ORDER — DEXAMETHASONE SODIUM PHOSPHATE 10 MG/ML IJ SOLN
10.0000 mg | Freq: Once | INTRAMUSCULAR | Status: AC
  Administered 2023-03-04: 10 mg via INTRAMUSCULAR

## 2023-03-04 NOTE — Discharge Instructions (Signed)
Your pain is likely due to a muscle strain which will improve on its own with time.   - You may take over the counter medicines to help with pain.  - If you were given a shot of pain medicine in the clinic today, you may start taking ibuprofen/other NSAIDs in 12-24 hours. - You may also take the prescribed muscle relaxer as directed as needed for muscle aches/spasm.  Do not take this medication and drive or drink alcohol as it can make you sleepy.  Mainly use this medicine at nighttime as needed. - Apply heat 20 minutes on then 20 minutes off and perform gentle range of motion exercises to the area of greatest pain to prevent muscle stiffness and provide further pain relief.   Red flag symptoms to watch out for are numbness/tingling to the legs, weakness, loss of bowel/bladder control, and/or worsening pain that does not respond well to medicines. Follow-up with your primary care provider or return to urgent care if your symptoms do not improve in the next 3 to 4 days with medications and interventions recommended today. If your symptoms are severe (red flag), please go to the emergency room.

## 2023-03-04 NOTE — ED Triage Notes (Signed)
Pt reports left sided neck pain x 4 days. States today the pain worsened and she vomited twice due to the pain. Reports taking aleve for pain with no relief. Denies any obvious injuries,

## 2023-03-04 NOTE — ED Provider Notes (Signed)
[facility]    CSN: [id number] Arrival date & time: 03/04/23  4098      History   Chief Complaint Chief Complaint  Patient presents with   Neck Pain    HPI Terri Reyes is a 62 y.o. female.   Patient presents to urgent care for evaluation of left sided neck pain that started 3 days ago while she was at work. States pain improved over the last couple of days since onset but worsened today. She has had 2 episodes of nausea with vomiting this morning secondary to severe pain to the left neck.  She works at [employer] and has to reach for items above her head frequently with the left arm. No recent known trauma/injuries to the left arm or the left neck. No midline neck pain, headaches, ear pain, fever/chills, chest pain, SOB, heart palpitations, or dizziness. No radicular pain down the left arm.  No abdominal pain, diarrhea, urinary symptoms, back pain, or dizziness.  She has been using Aleve at home for symptoms with some relief.   Neck Pain   Past Medical History:  Diagnosis Date   Chronic back pain    Frequency of urination    History of kidney stones    History of sepsis    Admitted 07-11-2015  urosepsis   Hypertension    Left ureteral stone    Nephrolithiasis    right-- nonobstructive per ct 07-11-2015   Urgency of urination     Patient Active Problem List   Diagnosis Date Noted   Hyperlipidemia 12/23/2022   Essential hypertension 12/17/2022   Tobacco use 12/17/2022   Psoriasis 12/17/2022   History of kidney stones 12/17/2022   Low back pain 09/26/2015    Past Surgical History:  Procedure Laterality Date   CYSTOSCOPY W/ URETERAL STENT PLACEMENT Left 09/06/2015   Procedure: CYSTOSCOPY WITH STENT REPLACEMENT;  Surgeon: Marcine Matar, MD;  Location: [facility];  Service: Urology;  Laterality: Left;   CYSTOSCOPY WITH STENT PLACEMENT Left 07/11/2015   Procedure: CYSTOSCOPY WITH STENT PLACEMENT LEFT RETROGRADE;  Surgeon: Marcine Matar, MD;  Location: [facility];  Service: Urology;  Laterality: Left;   CYSTOSCOPY WITH URETEROSCOPY Left 09/06/2015   Procedure: CYSTOSCOPY WITH URETEROSCOPY;  Surgeon: Marcine Matar, MD;  Location: [facility];  Service: Urology;  Laterality: Left;   SHOULDER ARTHROSCOPY W/ ROTATOR CUFF REPAIR Right x4  last one 2010   last one included labral debridement   STONE EXTRACTION WITH BASKET Left 09/06/2015   Procedure: STONE EXTRACTION WITH BASKET;  Surgeon: Marcine Matar, MD;  Location: [facility];  Service: Urology;  Laterality: Left;   URETEROLITHOTOMY  2007    OB History   No obstetric history on file.      Home Medications    Prior to Admission medications   Medication Sig Start Date End Date Taking? Authorizing Provider  ondansetron (ZOFRAN-ODT) 4 MG disintegrating tablet Take 1 tablet (4 mg total) by mouth every 8 (eight) hours as needed for nausea or vomiting. 03/04/23  Yes Charls Custer, Donavan Burnet, FNP  tiZANidine (ZANAFLEX) 4 MG tablet Take 1 tablet (4 mg total) by mouth every 8 (eight) hours as needed for muscle spasms. 03/04/23  Yes Carlisle Beers, FNP  betamethasone dipropionate (DIPROLENE) 0.05 % ointment Apply topically 2 (two) times daily. 12/11/22   [name], PA-C  losartan-hydrochlorothiazide (HYZAAR) 50-12.5 MG tablet Take 1 tablet by mouth daily. 12/17/22   Loyola Mast, MD  Na Sulfate-K Sulfate-Mg Sulf  17.5-3.13-1.6 GM/177ML SOLN See admin instructions. 01/02/23   [provider]  rosuvastatin (CRESTOR) 5 MG tablet Take 1 tablet (5 mg total) by mouth daily. 01/28/23   Loyola Mast, MD  dicyclomine (BENTYL) 20 MG tablet Take 1 tablet (20 mg total) by mouth 2 (two) times daily. 09/02/18 09/29/19  Rise Mu, PA-C    Family History Family History  Problem Relation Age of Onset   Heart disease Father 71   Cancer Brother        Melanoma   Colon polyps Neg Hx    Colon cancer Neg Hx    Esophageal cancer Neg Hx     Rectal cancer Neg Hx    Stomach cancer Neg Hx     Social History Social History   Tobacco Use   Smoking status: Every Day    Packs/day: 0.75    Years: 40.00    Additional pack years: 0.00    Total pack years: 30.00    Types: Cigarettes   Smokeless tobacco: Never  Vaping Use   Vaping Use: Never used  Substance Use Topics   Alcohol use: Yes    Alcohol/week: 0.0 standard drinks of alcohol    Comment: OCCASIONAL   Drug use: No     Allergies   Patient has no known allergies.   Review of Systems Review of Systems  Musculoskeletal:  Positive for neck pain.  Per HPI   Physical Exam Triage Vital Signs ED Triage Vitals  Enc Vitals Group     BP 03/04/23 1035 (!) 149/94     Pulse Rate 03/04/23 1035 80     Resp 03/04/23 1035 18     Temp 03/04/23 1035 (!) 97.5 F (36.4 C)     Temp Source 03/04/23 1035 Oral     SpO2 03/04/23 1035 97 %     Weight --      Height --      Head Circumference --      Peak Flow --      Pain Score 03/04/23 1032 6     Pain Loc --      Pain Edu? --      Excl. in GC? --    No data found.  Updated Vital Signs BP (!) 149/94 (BP Location: Right Arm)   Pulse 80   Temp (!) 97.5 F (36.4 C) (Oral)   Resp 18   SpO2 97%   Visual Acuity Right Eye Distance:   Left Eye Distance:   Bilateral Distance:    Right Eye Near:   Left Eye Near:    Bilateral Near:     Physical Exam Vitals and nursing note reviewed.  Constitutional:      Appearance: She is not ill-appearing or toxic-appearing.  HENT:     Head: Normocephalic and atraumatic.     Right Ear: Hearing, tympanic membrane, ear canal and external ear normal.     Left Ear: Hearing, tympanic membrane, ear canal and external ear normal.     Nose: Nose normal.     Mouth/Throat:     Lips: Pink.  Eyes:     General: Lids are normal. Vision grossly intact. Gaze aligned appropriately.     Extraocular Movements: Extraocular movements intact.     Conjunctiva/sclera: Conjunctivae normal.   Neck:     Comments: Full active range of motion of the neck.  No midline tenderness. Pulmonary:     Effort: Pulmonary effort is normal.  Musculoskeletal:     Cervical back:  Full passive range of motion without pain, normal range of motion and neck supple. No erythema, signs of trauma or crepitus. Spinous process tenderness (TTP to multiple points of palpation over the left medial trapezius muscle) and muscular tenderness present. No pain with movement. Normal range of motion.  Skin:    General: Skin is warm and dry.     Capillary Refill: Capillary refill takes less than 2 seconds.     Findings: No rash.  Neurological:     General: No focal deficit present.     Mental Status: She is alert and oriented to person, place, and time. Mental status is at baseline.     Cranial Nerves: No dysarthria or facial asymmetry.  Psychiatric:        Mood and Affect: Mood normal.        Speech: Speech normal.        Behavior: Behavior normal.        Thought Content: Thought content normal.        Judgment: Judgment normal.      UC Treatments / Results  Labs (all labs ordered are listed, but only abnormal results are displayed) Labs Reviewed - No data to display  EKG   Radiology No results found.  Procedures Procedures (including critical care time)  Medications Ordered in UC Medications  dexamethasone (DECADRON) injection 10 mg (has no administration in time range)  ondansetron (ZOFRAN-ODT) disintegrating tablet 4 mg (has no administration in time range)    Initial Impression / Assessment and Plan / UC Course  I have reviewed the triage vital signs and the nursing notes.  Pertinent labs & imaging results that were available during my care of the patient were reviewed by me and considered in my medical decision making (see chart for details).   1.  Spasm of left trapezius muscle Evaluation suggests pain is muscular in nature. Will manage this with rest, gentle ROM exercises, heat  therapy, ibuprofen as needed for pain, and as needed use of muscle relaxer. Drowsiness precautions discussed regarding muscle relaxer use. 10mg  decadron IM given for acute pain and inflammation.  Imaging: no indication for imaging based on stable musculoskeletal exam findings May follow-up with orthopedics as needed. Unclear etiology of nausea/vomiting. She is well-appearing with hemodynamically stable vital signs.  Zofran given in clinic for nausea.  May use this every 8 hours at home as needed for nausea and vomiting.   Discussed red flag signs and symptoms of worsening condition,when to call the PCP office, return to urgent care, and when to seek higher level of care in the emergency department. Counseled patient regarding appropriate use of medications and potential side effects for all medications recommended or prescribed today. Patient verbalizes understanding and agreement with plan. Discharged in stable condition.     Final Clinical Impressions(s) / UC Diagnoses   Final diagnoses:  Spasm of left trapezius muscle  Nausea and vomiting, unspecified vomiting type     Discharge Instructions      Your pain is likely due to a muscle strain which will improve on its own with time.   - You may take over the counter medicines to help with pain.  - If you were given a shot of pain medicine in the clinic today, you may start taking ibuprofen/other NSAIDs in 12-24 hours. - You may also take the prescribed muscle relaxer as directed as needed for muscle aches/spasm.  Do not take this medication and drive or drink alcohol as it can make you  sleepy.  Mainly use this medicine at nighttime as needed. - Apply heat 20 minutes on then 20 minutes off and perform gentle range of motion exercises to the area of greatest pain to prevent muscle stiffness and provide further pain relief.   Red flag symptoms to watch out for are numbness/tingling to the legs, weakness, loss of bowel/bladder control, and/or  worsening pain that does not respond well to medicines. Follow-up with your primary care provider or return to urgent care if your symptoms do not improve in the next 3 to 4 days with medications and interventions recommended today. If your symptoms are severe (red flag), please go to the emergency room.       ED Prescriptions     Medication Sig Dispense Auth. Provider   tiZANidine (ZANAFLEX) 4 MG tablet Take 1 tablet (4 mg total) by mouth every 8 (eight) hours as needed for muscle spasms. 20 tablet Reita May M, FNP   ondansetron (ZOFRAN-ODT) 4 MG disintegrating tablet Take 1 tablet (4 mg total) by mouth every 8 (eight) hours as needed for nausea or vomiting. 20 tablet Carlisle Beers, FNP      PDMP not reviewed this encounter.   Carlisle Beers, [place] 03/04/23 1106

## 2023-03-09 ENCOUNTER — Encounter: Payer: Self-pay | Admitting: Family Medicine

## 2023-03-09 ENCOUNTER — Other Ambulatory Visit: Payer: BC Managed Care – PPO

## 2023-03-09 DIAGNOSIS — J439 Emphysema, unspecified: Secondary | ICD-10-CM | POA: Insufficient documentation

## 2023-03-09 DIAGNOSIS — I2510 Atherosclerotic heart disease of native coronary artery without angina pectoris: Secondary | ICD-10-CM | POA: Insufficient documentation

## 2023-03-09 DIAGNOSIS — I700 Atherosclerosis of aorta: Secondary | ICD-10-CM | POA: Insufficient documentation

## 2023-03-30 ENCOUNTER — Other Ambulatory Visit: Payer: Self-pay | Admitting: Family Medicine

## 2023-03-30 DIAGNOSIS — I10 Essential (primary) hypertension: Secondary | ICD-10-CM

## 2023-04-07 ENCOUNTER — Ambulatory Visit
Admission: RE | Admit: 2023-04-07 | Discharge: 2023-04-07 | Disposition: A | Discharge status: Home / Self Care | Payer: BC Managed Care – PPO | Source: Ambulatory Visit | Attending: Family Medicine | Admitting: Family Medicine

## 2023-04-07 ENCOUNTER — Other Ambulatory Visit: Payer: Self-pay | Admitting: Family Medicine

## 2023-04-07 DIAGNOSIS — R928 Other abnormal and inconclusive findings on diagnostic imaging of breast: Secondary | ICD-10-CM

## 2023-04-07 DIAGNOSIS — N6320 Unspecified lump in the left breast, unspecified quadrant: Secondary | ICD-10-CM

## 2023-04-07 DIAGNOSIS — N6342 Unspecified lump in left breast, subareolar: Secondary | ICD-10-CM | POA: Diagnosis not present

## 2023-04-09 ENCOUNTER — Encounter (INDEPENDENT_AMBULATORY_CARE_PROVIDER_SITE_OTHER): Payer: Self-pay

## 2023-04-30 ENCOUNTER — Ambulatory Visit: Payer: BC Managed Care – PPO | Admitting: Family Medicine

## 2023-06-02 ENCOUNTER — Other Ambulatory Visit: Payer: Self-pay | Admitting: Family Medicine

## 2023-06-02 DIAGNOSIS — I10 Essential (primary) hypertension: Secondary | ICD-10-CM

## 2023-06-02 MED ORDER — LOSARTAN POTASSIUM-HCTZ 50-12.5 MG PO TABS: 1.0000 | ORAL_TABLET | Freq: Every day | ORAL | 0 refills | Status: DC

## 2023-07-28 ENCOUNTER — Other Ambulatory Visit: Payer: Self-pay | Admitting: Family Medicine

## 2023-07-28 DIAGNOSIS — N6320 Unspecified lump in the left breast, unspecified quadrant: Secondary | ICD-10-CM

## 2023-09-02 ENCOUNTER — Ambulatory Visit (INDEPENDENT_AMBULATORY_CARE_PROVIDER_SITE_OTHER): Payer: BC Managed Care – PPO

## 2023-09-02 ENCOUNTER — Ambulatory Visit
Admission: RE | Admit: 2023-09-02 | Discharge: 2023-09-02 | Disposition: A | Discharge status: Home / Self Care | Payer: BC Managed Care – PPO | Source: Ambulatory Visit | Attending: Family Medicine | Admitting: Family Medicine

## 2023-09-02 VITALS — BP 134/84 | HR 83 | Temp 97.9°F | Resp 16

## 2023-09-02 DIAGNOSIS — R0789 Other chest pain: Secondary | ICD-10-CM | POA: Diagnosis not present

## 2023-09-02 DIAGNOSIS — S2232XA Fracture of one rib, left side, initial encounter for closed fracture: Secondary | ICD-10-CM | POA: Diagnosis not present

## 2023-09-02 DIAGNOSIS — R0781 Pleurodynia: Secondary | ICD-10-CM

## 2023-09-02 MED ORDER — ACETAMINOPHEN 325 MG PO TABS: 650.0000 mg | ORAL_TABLET | Freq: Four times a day (QID) | ORAL | 0 refills | Status: AC | PRN

## 2023-09-02 MED ORDER — CYCLOBENZAPRINE HCL 5 MG PO TABS: 5.0000 mg | ORAL_TABLET | Freq: Every evening | ORAL | 0 refills | Status: DC | PRN

## 2023-09-02 MED ORDER — TRAMADOL HCL 50 MG PO TABS
50.0000 mg | ORAL_TABLET | Freq: Four times a day (QID) | ORAL | 0 refills | Status: DC | PRN
Start: 2023-09-02 — End: 2023-12-04

## 2023-09-02 NOTE — ED Triage Notes (Signed)
 Pt reports left sided rib cage pan after she fel when she was walking her dog and hit the ribs with a truck. Pain is worse with "pretty much everything". Aleve gives no relief.

## 2023-09-02 NOTE — ED Provider Notes (Signed)
 [name]  Note:  This document was prepared using Conservation officer, historic buildings and may include unintentional dictation errors.  MRN: [id number] DOB: 04/12/1961  Subjective:   Terri Reyes is a 63 y.o. female presenting for suffering a left rib injury yesterday.  Patient was walking her dog and accidentally fell making impact with her torso against the end of a truck.  Patient is having significant difficulty moving in any kind of direction.  Has a history of heart disease, emphysema.  No current facility-administered medications for this encounter.  Current Outpatient Medications:    naproxen  sodium (ALEVE ) 220 MG tablet, Take 220 mg by mouth., Disp: , Rfl:    betamethasone  dipropionate (DIPROLENE ) 0.05 % ointment, Apply topically 2 (two) times daily., Disp: 50 g, Rfl: 1   losartan -hydrochlorothiazide (HYZAAR) 50-12.5 MG tablet, Take 1 tablet by mouth daily. Need appointment for further refills., Disp: 90 tablet, Rfl: 0   Na Sulfate-K Sulfate-Mg Sulf 17.5-3.13-1.6 GM/177ML SOLN, See admin instructions., Disp: , Rfl:    ondansetron  (ZOFRAN -ODT) 4 MG disintegrating tablet, Take 1 tablet (4 mg total) by mouth every 8 (eight) hours as needed for nausea or vomiting., Disp: 20 tablet, Rfl: 0   rosuvastatin  (CRESTOR ) 5 MG tablet, Take 1 tablet (5 mg total) by mouth daily., Disp: 90 tablet, Rfl: 3   tiZANidine  (ZANAFLEX ) 4 MG tablet, Take 1 tablet (4 mg total) by mouth every 8 (eight) hours as needed for muscle spasms., Disp: 20 tablet, Rfl: 0   No Known Allergies  Past Medical History:  Diagnosis Date   Chronic back pain    Frequency of urination    History of kidney stones    History of sepsis    Admitted 07-11-2015  urosepsis   Hypertension    Left ureteral stone    Nephrolithiasis    right-- nonobstructive per ct 07-11-2015   Urgency of urination      Past Surgical History:  Procedure Laterality Date   CYSTOSCOPY W/ URETERAL STENT PLACEMENT Left  09/06/2015   Procedure: CYSTOSCOPY WITH STENT REPLACEMENT;  Surgeon: Garnette Shack, MD;  Location: [facility];  Service: Urology;  Laterality: Left;   CYSTOSCOPY WITH STENT PLACEMENT Left 07/11/2015   Procedure: CYSTOSCOPY WITH STENT PLACEMENT LEFT RETROGRADE;  Surgeon: Garnette Shack, MD;  Location: [facility];  Service: Urology;  Laterality: Left;   CYSTOSCOPY WITH URETEROSCOPY Left 09/06/2015   Procedure: CYSTOSCOPY WITH URETEROSCOPY;  Surgeon: Garnette Shack, MD;  Location: [facility];  Service: Urology;  Laterality: Left;   SHOULDER ARTHROSCOPY W/ ROTATOR CUFF REPAIR Right x4  last one 2010   last one included labral debridement   STONE EXTRACTION WITH BASKET Left 09/06/2015   Procedure: STONE EXTRACTION WITH BASKET;  Surgeon: Garnette Shack, MD;  Location: [facility];  Service: Urology;  Laterality: Left;   URETEROLITHOTOMY  2007    Family History  Problem Relation Age of Onset   Heart disease Father 78   Cancer Brother        Melanoma   Colon polyps Neg Hx    Colon cancer Neg Hx    Esophageal cancer Neg Hx    Rectal cancer Neg Hx    Stomach cancer Neg Hx     Social History   Tobacco Use   Smoking status: Every Day    Current packs/day: 0.75    Average packs/day: 0.8 packs/day for 40.0 years (30.0 ttl pk-yrs)    Types: Cigarettes   Smokeless tobacco: Never  Vaping Use   Vaping status: Never Used  Substance Use Topics   Alcohol use: Yes    Alcohol/week: 0.0 standard drinks of alcohol    Comment: OCCASIONAL   Drug use: No    ROS   Objective:   Vitals: BP 134/84 (BP Location: Right Arm)   Pulse 83   Temp 97.9 F (36.6 C) (Oral)   Resp 16   SpO2 98%   Physical Exam Constitutional:      General: She is not in acute distress.    Appearance: Normal appearance. She is well-developed. She is not ill-appearing, toxic-appearing or diaphoretic.  HENT:     Head: Normocephalic and atraumatic.     Nose: Nose  normal.     Mouth/Throat:     Mouth: Mucous membranes are moist.  Eyes:     General: No scleral icterus.       Right eye: No discharge.        Left eye: No discharge.     Extraocular Movements: Extraocular movements intact.  Cardiovascular:     Rate and Rhythm: Normal rate and regular rhythm.     Heart sounds: Normal heart sounds. No murmur heard.    No friction rub. No gallop.  Pulmonary:     Effort: Pulmonary effort is normal. No respiratory distress.     Breath sounds: No stridor. No wheezing, rhonchi or rales.  Chest:     Chest wall: Tenderness (over area outlined) present.    Skin:    General: Skin is warm and dry.  Neurological:     General: No focal deficit present.     Mental Status: She is alert and oriented to person, place, and time.  Psychiatric:        Mood and Affect: Mood normal.        Behavior: Behavior normal.     Assessment and Plan :   PDMP not reviewed this encounter.  1. Left-sided chest wall pain   2. Rib pain    X-ray over-read was pending at time of discharge, recommended follow up with only abnormal results. Otherwise will not call for negative over-read. Patient was in agreement.  Recommended Tylenol , tramadol  for breakthrough pain and Flexeril  at bedtime for general management of her rib contusion, possible rib fracture.  Counseled patient on potential for adverse effects with medications prescribed/recommended today, ER and return-to-clinic precautions discussed, patient verbalized understanding.    Christopher Savannah, [place] 09/02/23 1429

## 2023-09-02 NOTE — Discharge Instructions (Addendum)
 I will call with your x-ray results later today. Please schedule Tylenol  at 500 mg - 650 mg once every 6 hours as needed for aches and pains.  If you still have pain despite taking Tylenol  regularly, this is breakthrough pain.  You can use tramadol  once every 6 hours for this.  Once your pain is better controlled, switch back to just Tylenol . Please use the muscle relaxant, cyclobenzaprine , at bed time for your rib pain.

## 2023-09-04 ENCOUNTER — Ambulatory Visit: Payer: Self-pay | Admitting: Family Medicine
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
## 2023-09-07 ENCOUNTER — Telehealth: Payer: Self-pay

## 2023-09-07 NOTE — Telephone Encounter (Signed)
 Called patient and she is feeling a little better but still very sore with the broken rib.  She has take all of her Tramadol  and is now just taking Aleve  for the pain.   Advised that she would need a appointment to be seen to get a refill on Tramadol .  She states that she will just continue taking the Aleve  for now. Dm/cma

## 2023-09-11 ENCOUNTER — Ambulatory Visit (INDEPENDENT_AMBULATORY_CARE_PROVIDER_SITE_OTHER): Payer: BC Managed Care – PPO

## 2023-09-11 ENCOUNTER — Ambulatory Visit
Admission: RE | Admit: 2023-09-11 | Discharge: 2023-09-11 | Disposition: A | Discharge status: Home / Self Care | Payer: BC Managed Care – PPO | Source: Ambulatory Visit | Attending: Family Medicine | Admitting: Family Medicine

## 2023-09-11 VITALS — BP 120/83 | Temp 98.5°F | Resp 18

## 2023-09-11 DIAGNOSIS — S2232XA Fracture of one rib, left side, initial encounter for closed fracture: Secondary | ICD-10-CM | POA: Diagnosis not present

## 2023-09-11 DIAGNOSIS — S2242XA Multiple fractures of ribs, left side, initial encounter for closed fracture: Secondary | ICD-10-CM

## 2023-09-11 DIAGNOSIS — F17200 Nicotine dependence, unspecified, uncomplicated: Secondary | ICD-10-CM | POA: Diagnosis not present

## 2023-09-11 DIAGNOSIS — J90 Pleural effusion, not elsewhere classified: Secondary | ICD-10-CM | POA: Diagnosis not present

## 2023-09-11 DIAGNOSIS — R0781 Pleurodynia: Secondary | ICD-10-CM

## 2023-09-11 DIAGNOSIS — R059 Cough, unspecified: Secondary | ICD-10-CM | POA: Diagnosis not present

## 2023-09-11 MED ORDER — PREDNISONE 20 MG PO TABS: ORAL_TABLET | ORAL | 0 refills | Status: DC

## 2023-09-11 NOTE — Discharge Instructions (Addendum)
Unfortunately, your x-ray is showing that your rib fracture from your last visit is a bit more displaced.  You also have a new nondisplaced rib fracture just above that.  I want to emphasize that you hold off on smoking of any kind so as to prevent any kind of coughing spells or bronchospasms that would worsen your rib fractures.  For this reason I prescribed you the prednisone course.  I also need you to follow-up with the cardiothoracic surgeons with [facility] as soon as possible.  Please find their number listed in your visit summary.

## 2023-09-11 NOTE — ED Provider Notes (Signed)
[name]  Note:  This document was prepared using Conservation officer, historic buildings and may include unintentional dictation errors.  MRN: [id number] DOB: [birth year]  Subjective:   Terri Reyes is a 63 y.o. female presenting for 1 week history of persistent left-sided rib pain.  Patient reports that she has had persistent pain to the area.  She has used tramadol consistently and is currently out of it.  Has also used a muscle relaxant and Tylenol.  She has not had any new traumas.  She continues to smoke.  Has a history of emphysema.  No current facility-administered medications for this encounter.  Current Outpatient Medications:    acetaminophen (TYLENOL) 325 MG tablet, Take 2 tablets (650 mg total) by mouth every 6 (six) hours as needed for moderate pain (pain score 4-6)., Disp: 30 tablet, Rfl: 0   betamethasone dipropionate (DIPROLENE) 0.05 % ointment, Apply topically 2 (two) times daily., Disp: 50 g, Rfl: 1   cyclobenzaprine (FLEXERIL) 5 MG tablet, Take 1 tablet (5 mg total) by mouth at bedtime as needed., Disp: 30 tablet, Rfl: 0   losartan-hydrochlorothiazide (HYZAAR) 50-12.5 MG tablet, Take 1 tablet by mouth daily. Need appointment for further refills., Disp: 90 tablet, Rfl: 0   Na Sulfate-K Sulfate-Mg Sulf 17.5-3.13-1.6 GM/177ML SOLN, See admin instructions., Disp: , Rfl:    naproxen sodium (ALEVE) 220 MG tablet, Take 220 mg by mouth., Disp: , Rfl:    ondansetron (ZOFRAN-ODT) 4 MG disintegrating tablet, Take 1 tablet (4 mg total) by mouth every 8 (eight) hours as needed for nausea or vomiting., Disp: 20 tablet, Rfl: 0   rosuvastatin (CRESTOR) 5 MG tablet, Take 1 tablet (5 mg total) by mouth daily., Disp: 90 tablet, Rfl: 3   tiZANidine (ZANAFLEX) 4 MG tablet, Take 1 tablet (4 mg total) by mouth every 8 (eight) hours as needed for muscle spasms., Disp: 20 tablet, Rfl: 0   traMADol (ULTRAM) 50 MG tablet, Take 1 tablet (50 mg total) by mouth every 6 (six) hours  as needed., Disp: 15 tablet, Rfl: 0   No Known Allergies  Past Medical History:  Diagnosis Date   Chronic back pain    Frequency of urination    History of kidney stones    History of sepsis    Admitted 07-11-2015  urosepsis   Hypertension    Left ureteral stone    Nephrolithiasis    right-- nonobstructive per ct 07-11-2015   Urgency of urination      Past Surgical History:  Procedure Laterality Date   CYSTOSCOPY W/ URETERAL STENT PLACEMENT Left 09/06/2015   Procedure: CYSTOSCOPY WITH STENT REPLACEMENT;  Surgeon: Marcine Matar, MD;  Location: [facility];  Service: Urology;  Laterality: Left;   CYSTOSCOPY WITH STENT PLACEMENT Left 07/11/2015   Procedure: CYSTOSCOPY WITH STENT PLACEMENT LEFT RETROGRADE;  Surgeon: Marcine Matar, MD;  Location: [facility];  Service: Urology;  Laterality: Left;   CYSTOSCOPY WITH URETEROSCOPY Left 09/06/2015   Procedure: CYSTOSCOPY WITH URETEROSCOPY;  Surgeon: Marcine Matar, MD;  Location: [facility];  Service: Urology;  Laterality: Left;   SHOULDER ARTHROSCOPY W/ ROTATOR CUFF REPAIR Right x4  last one 2010   last one included labral debridement   STONE EXTRACTION WITH BASKET Left 09/06/2015   Procedure: STONE EXTRACTION WITH BASKET;  Surgeon: Marcine Matar, MD;  Location: [facility];  Service: Urology;  Laterality: Left;   URETEROLITHOTOMY  2007    Family History  Problem Relation Age of Onset  Heart disease Father 43   Cancer Brother        Melanoma   Colon polyps Neg Hx    Colon cancer Neg Hx    Esophageal cancer Neg Hx    Rectal cancer Neg Hx    Stomach cancer Neg Hx     Social History   Tobacco Use   Smoking status: Every Day    Current packs/day: 0.75    Average packs/day: 0.8 packs/day for 40.0 years (30.0 ttl pk-yrs)    Types: Cigarettes   Smokeless tobacco: Never  Vaping Use   Vaping status: Never Used  Substance Use Topics   Alcohol use: Yes    Alcohol/week: 0.0  standard drinks of alcohol    Comment: OCCASIONAL   Drug use: No    ROS   Objective:   Vitals: There were no vitals taken for this visit.  Physical Exam Constitutional:      General: She is not in acute distress.    Appearance: Normal appearance. She is well-developed. She is not ill-appearing, toxic-appearing or diaphoretic.  HENT:     Head: Normocephalic and atraumatic.     Nose: Nose normal.     Mouth/Throat:     Mouth: Mucous membranes are moist.  Eyes:     General: No scleral icterus.       Right eye: No discharge.        Left eye: No discharge.     Extraocular Movements: Extraocular movements intact.  Cardiovascular:     Rate and Rhythm: Normal rate and regular rhythm.     Heart sounds: Normal heart sounds. No murmur heard.    No friction rub. No gallop.  Pulmonary:     Effort: Pulmonary effort is normal. No respiratory distress.     Breath sounds: No stridor. No wheezing, rhonchi or rales.  Chest:     Chest wall: Tenderness (lateral left chest wall mid to lower side) present.  Skin:    General: Skin is warm and dry.  Neurological:     General: No focal deficit present.     Mental Status: She is alert and oriented to person, place, and time.  Psychiatric:        Mood and Affect: Mood normal.        Behavior: Behavior normal.    DG Ribs Unilateral W/Chest Left Result Date: 09/11/2023 CLINICAL DATA:  Left rib pain after coughing EXAM: LEFT RIBS AND CHEST - 3+ VIEW COMPARISON:  09/02/2023 FINDINGS: Increased displacement of the previously noted fracture of the left sixth rib, with new mildly displaced fracture in the left fifth rib. There is no evidence of pneumothorax. Trace left pleural effusion. No other focal pulmonary opacity. Heart size and mediastinal contours are within normal limits. IMPRESSION: 1. Increased displacement of the previously noted fracture of the left sixth rib, with new mildly displaced fracture in the left fifth rib. No evidence of  pneumothorax. 2. Trace left pleural effusion. Electronically Signed   By: Wiliam Ke M.D.   On: 09/11/2023 14:42     Assessment and Plan :   PDMP not reviewed this encounter.  1. Rib pain on left side   2. Smoker   3. Closed fracture of multiple ribs of left side, initial encounter    Emphasized need to follow-up with cardiothoracic surgery given that her previously seen left sixth rib fracture is now more displaced and she has a new left fifth rib fracture.  Advised that she stop smoking completely.  Use  prednisone course in the context of her emphysema and respiratory symptoms.  Continue using the muscle relaxant.  Counseled patient on potential for adverse effects with medications prescribed/recommended today, ER and return-to-clinic precautions discussed, patient verbalized understanding.    [name], PA-C 09/11/23 1558

## 2023-09-11 NOTE — ED Triage Notes (Signed)
Pt reports she is having left sided rib cage pain x 1 week. Reports she was seen lats week fro same complaint and pain is not improving. Reports she was coughing lats night and felt something pop in her rib cage. Taking Aleve.

## 2023-10-04 ENCOUNTER — Other Ambulatory Visit: Payer: Self-pay | Admitting: Family Medicine

## 2023-10-04 DIAGNOSIS — I10 Essential (primary) hypertension: Secondary | ICD-10-CM

## 2023-10-05 ENCOUNTER — Other Ambulatory Visit: Payer: Self-pay | Admitting: Family Medicine

## 2023-10-05 DIAGNOSIS — E782 Mixed hyperlipidemia: Secondary | ICD-10-CM

## 2023-10-05 NOTE — Telephone Encounter (Signed)
 Left VM to rtn call to schedule an appointment.  Dm/cma

## 2023-10-05 NOTE — Telephone Encounter (Signed)
 Copied from CRM [id number]. Topic: Clinical - Medication Refill >> Oct 05, 2023 10:34 AM Barney Boozer wrote: Most Recent Primary Care Visit:  Provider: Graig Lawyer  Department: [facility]  Visit Type: OFFICE VISIT  Date: 01/28/2023  Medication: rosuvastatin  (CRESTOR ) 5 MG tablet  Has the patient contacted their pharmacy? Yes (Agent: If no, request that the patient contact the pharmacy for the refill. If patient does not wish to contact the pharmacy document the reason why and proceed with request.) (Agent: If yes, when and what did the pharmacy advise?)  Is this the correct pharmacy for this prescription? Yes If no, delete pharmacy and type the correct one.  This is the patient's preferred pharmacy:  [id number] - [place], [place] - [address] [place] [address] Phone: [phone] Fax: [phone]     Has the prescription been filled recently? No  Is the patient out of the medication? No  Has the patient been seen for an appointment in the last year OR does the patient have an upcoming appointment? Yes  Can we respond through MyChart? Yes  Agent: Please be advised that Rx refills may take up to 3 business days. We ask that you follow-up with your pharmacy.

## 2023-10-05 NOTE — Telephone Encounter (Signed)
 Last Fill: 01/28/23  Last OV: 09/11/23 Next OV: 10/14/23  Routing to provider for review/authorization.

## 2023-10-06 MED ORDER — ROSUVASTATIN CALCIUM 5 MG PO TABS
5.0000 mg | ORAL_TABLET | Freq: Every day | ORAL | 0 refills | Status: DC
Start: 2023-10-06 — End: 2023-12-04

## 2023-10-14 ENCOUNTER — Encounter: Payer: BC Managed Care – PPO | Admitting: Family Medicine

## 2023-10-30 ENCOUNTER — Other Ambulatory Visit: Payer: Self-pay | Admitting: Family Medicine

## 2023-10-30 DIAGNOSIS — I10 Essential (primary) hypertension: Secondary | ICD-10-CM

## 2023-10-30 NOTE — Telephone Encounter (Signed)
 Left VM to rtn call to schedule a f/u appt. Dm/cma

## 2023-11-03 ENCOUNTER — Other Ambulatory Visit: Payer: BC Managed Care – PPO

## 2023-11-29 ENCOUNTER — Other Ambulatory Visit: Payer: Self-pay | Admitting: Family Medicine

## 2023-11-29 DIAGNOSIS — I10 Essential (primary) hypertension: Secondary | ICD-10-CM

## 2023-12-02 ENCOUNTER — Ambulatory Visit
Admission: RE | Admit: 2023-12-02 | Discharge: 2023-12-02 | Disposition: A | Discharge status: Home / Self Care | Source: Ambulatory Visit | Attending: Family Medicine

## 2023-12-02 DIAGNOSIS — N6320 Unspecified lump in the left breast, unspecified quadrant: Secondary | ICD-10-CM

## 2023-12-02 DIAGNOSIS — N6325 Unspecified lump in the left breast, overlapping quadrants: Secondary | ICD-10-CM | POA: Diagnosis not present

## 2023-12-03 ENCOUNTER — Other Ambulatory Visit: Payer: Self-pay | Admitting: Family Medicine

## 2023-12-03 DIAGNOSIS — N6320 Unspecified lump in the left breast, unspecified quadrant: Secondary | ICD-10-CM

## 2023-12-04 ENCOUNTER — Encounter: Payer: Self-pay | Admitting: Family Medicine

## 2023-12-04 ENCOUNTER — Ambulatory Visit (INDEPENDENT_AMBULATORY_CARE_PROVIDER_SITE_OTHER): Admitting: Family Medicine

## 2023-12-04 VITALS — BP 134/86 | HR 75 | Ht 66.5 in | Wt 153.4 lb

## 2023-12-04 DIAGNOSIS — F17210 Nicotine dependence, cigarettes, uncomplicated: Secondary | ICD-10-CM

## 2023-12-04 DIAGNOSIS — R21 Rash and other nonspecific skin eruption: Secondary | ICD-10-CM | POA: Insufficient documentation

## 2023-12-04 DIAGNOSIS — L409 Psoriasis, unspecified: Secondary | ICD-10-CM

## 2023-12-04 DIAGNOSIS — I10 Essential (primary) hypertension: Secondary | ICD-10-CM

## 2023-12-04 DIAGNOSIS — I2510 Atherosclerotic heart disease of native coronary artery without angina pectoris: Secondary | ICD-10-CM

## 2023-12-04 DIAGNOSIS — Z0000 Encounter for general adult medical examination without abnormal findings: Secondary | ICD-10-CM

## 2023-12-04 DIAGNOSIS — N6342 Unspecified lump in left breast, subareolar: Secondary | ICD-10-CM | POA: Insufficient documentation

## 2023-12-04 DIAGNOSIS — Z720 Tobacco use: Secondary | ICD-10-CM

## 2023-12-04 DIAGNOSIS — K029 Dental caries, unspecified: Secondary | ICD-10-CM | POA: Insufficient documentation

## 2023-12-04 DIAGNOSIS — E782 Mixed hyperlipidemia: Secondary | ICD-10-CM | POA: Diagnosis not present

## 2023-12-04 LAB — BASIC METABOLIC PANEL WITH GFR
BUN: 18 mg/dL (ref 6–23)
CO2: 27 meq/L (ref 19–32)
Calcium: 10.2 mg/dL (ref 8.4–10.5)
Chloride: 102 meq/L (ref 96–112)
Creatinine, Ser: 0.69 mg/dL (ref 0.40–1.20)
GFR: 92.01 mL/min (ref >60.00)
Glucose, Bld: 80 mg/dL (ref 70–99)
Potassium: 3.7 meq/L (ref 3.5–5.1)
Sodium: 138 meq/L (ref 135–145)

## 2023-12-04 LAB — LIPID PANEL
Cholesterol: 239 mg/dL — ABNORMAL HIGH (ref 0–200)
HDL: 107.9 mg/dL (ref >39.00)
LDL Cholesterol: 120 mg/dL — ABNORMAL HIGH (ref 0–99)
NonHDL: 130.86
Total CHOL/HDL Ratio: 2
Triglycerides: 54 mg/dL (ref 0.0–149.0)
VLDL: 10.8 mg/dL (ref 0.0–40.0)

## 2023-12-04 LAB — SEDIMENTATION RATE: Sed Rate: 5 mm/h (ref 0–30)

## 2023-12-04 LAB — C-REACTIVE PROTEIN: CRP: <1 mg/dL (ref 0.5–20.0)

## 2023-12-04 MED ORDER — ROSUVASTATIN CALCIUM 10 MG PO TABS
5.0000 mg | ORAL_TABLET | Freq: Every day | ORAL | 3 refills | Status: AC
Start: 2023-12-04 — End: ?

## 2023-12-04 MED ORDER — LOSARTAN POTASSIUM-HCTZ 100-12.5 MG PO TABS
1.0000 | ORAL_TABLET | Freq: Every day | ORAL | 3 refills | Status: AC
Start: 2023-12-04 — End: ?

## 2023-12-04 MED ORDER — ROSUVASTATIN CALCIUM 5 MG PO TABS
5.0000 mg | ORAL_TABLET | Freq: Every day | ORAL | 2 refills | Status: DC
Start: 2023-12-04 — End: 2023-12-04

## 2023-12-04 MED ORDER — BETAMETHASONE DIPROPIONATE 0.05 % EX OINT
TOPICAL_OINTMENT | Freq: Two times a day (BID) | CUTANEOUS | 1 refills | Status: AC
Start: 2023-12-04 — End: ?

## 2023-12-04 NOTE — Assessment & Plan Note (Signed)
 I continue to strongly urge smoking cessation. We discussed how she might use a nicotine patch, starting with a 14 mg/day patch.  I spent 4 minutes counseling the patient about tobacco cessation.

## 2023-12-04 NOTE — Assessment & Plan Note (Signed)
 Urged Terri Reyes to seek dental care. Her severe dental caries and gum disease can increase cardiovascular risk.

## 2023-12-04 NOTE — Assessment & Plan Note (Signed)
 Blood pressure is mildly high today. This had been in better control at her last 2 visits. I will monitor for now. Continue losartan-HCTZ (Hyzaar) 50-12.5 mg daily. I will check annual renal labs.

## 2023-12-04 NOTE — Assessment & Plan Note (Signed)
 The facial rash may represent rosacea. However, in light of her psoriasis, I would consider other possible rheumatic issues. I will check an ANA and CRP/ESR.

## 2023-12-04 NOTE — Addendum Note (Signed)
 Addended by: Loyola Mast on: 12/04/2023 01:14 PM   Modules accepted: Orders

## 2023-12-04 NOTE — Progress Notes (Signed)
 [facility] [address] [place] [address] Dept: [phone] Dept Fax: [phone]  Annual Physical Visit  Subjective:    Patient ID: Terri Reyes, female    DOB: September 10, 1960, 63 y.o..   MRN: [id number]  Chief Complaint  Patient presents with   Annual Exam    CPE/labs.  Fasting today.  No concerns.     History of Present Illness:  Patient is in today for an annual physical/preventative visit.  Terri Reyes has a history of high blood pressure. Currently, she is on losartan-HCTZ 50-12.5 mg daily.   Terri Reyes has a history of coronary artery calcifications and hyperlipidemia. She is managed on rosuvastatin 5 mg daily. She continues to smoke 1/2 ppd of cigarettes. She thinks about quitting. She tried a medicine (?Chantix) in the past, but felt  this made her feel edgy.  Review of Systems  Constitutional:  Negative for chills, diaphoresis, fever, malaise/fatigue and weight loss.  HENT:  Negative for congestion, ear pain, hearing loss, sinus pain, sore throat and tinnitus.   Eyes:  Negative for blurred vision, pain, discharge and redness.  Respiratory:  Negative for cough, shortness of breath and wheezing.   Cardiovascular:  Negative for chest pain and palpitations.  Gastrointestinal:  Negative for abdominal pain, constipation, diarrhea, heartburn, nausea and vomiting.  Musculoskeletal:  Negative for back pain, joint pain and myalgias.  Skin:  Positive for rash. Negative for itching.       Has a history of psoriasis, primarily involving the lower legs. She manages this with betamethasone cream.  Psychiatric/Behavioral:  Negative for depression. The patient is not nervous/anxious.    Past Medical History: Patient Active Problem List   Diagnosis Date Noted   Tooth decay 12/04/2023   Subareolar mass of left breast 12/04/2023   Malar rash 12/04/2023   Aortic atherosclerosis (HCC) 03/09/2023   Coronary atherosclerosis  03/09/2023   Emphysema of lung (HCC) 03/09/2023   Hyperlipidemia 12/23/2022   Essential hypertension 12/17/2022   Tobacco use 12/17/2022   Psoriasis 12/17/2022   History of kidney stones 12/17/2022   Low back pain 09/26/2015   Past Surgical History:  Procedure Laterality Date   CYSTOSCOPY W/ URETERAL STENT PLACEMENT Left 09/06/2015   Procedure: CYSTOSCOPY WITH STENT REPLACEMENT;  Surgeon: Marcine Matar, MD;  Location: [facility];  Service: Urology;  Laterality: Left;   CYSTOSCOPY WITH STENT PLACEMENT Left 07/11/2015   Procedure: CYSTOSCOPY WITH STENT PLACEMENT LEFT RETROGRADE;  Surgeon: Marcine Matar, MD;  Location: [facility];  Service: Urology;  Laterality: Left;   CYSTOSCOPY WITH URETEROSCOPY Left 09/06/2015   Procedure: CYSTOSCOPY WITH URETEROSCOPY;  Surgeon: Marcine Matar, MD;  Location: [facility];  Service: Urology;  Laterality: Left;   SHOULDER ARTHROSCOPY W/ ROTATOR CUFF REPAIR Right x4  last one 2010   last one included labral debridement   STONE EXTRACTION WITH BASKET Left 09/06/2015   Procedure: STONE EXTRACTION WITH BASKET;  Surgeon: Marcine Matar, MD;  Location: [facility];  Service: Urology;  Laterality: Left;   URETEROLITHOTOMY  2007   Family History  Problem Relation Age of Onset   Heart disease Father 76   Cancer Brother        Melanoma   Colon polyps Neg Hx    Colon cancer Neg Hx    Esophageal cancer Neg Hx    Rectal cancer Neg Hx    Stomach cancer Neg Hx    Outpatient Medications Prior to Visit  Medication Sig Dispense  Refill   acetaminophen (TYLENOL) 325 MG tablet Take 2 tablets (650 mg total) by mouth every 6 (six) hours as needed for moderate pain (pain score 4-6). 30 tablet 0   naproxen sodium (ALEVE) 220 MG tablet Take 220 mg by mouth.     ondansetron (ZOFRAN-ODT) 4 MG disintegrating tablet Take 1 tablet (4 mg total) by mouth every 8 (eight) hours as needed for nausea or vomiting. 20 tablet 0    tiZANidine (ZANAFLEX) 4 MG tablet Take 1 tablet (4 mg total) by mouth every 8 (eight) hours as needed for muscle spasms. 20 tablet 0   betamethasone dipropionate (DIPROLENE) 0.05 % ointment Apply topically 2 (two) times daily. 50 g 1   losartan-hydrochlorothiazide (HYZAAR) 50-12.5 MG tablet TAKE 1 TABLET BY MOUTH EVERY DAY NEED APPOINMENT FOR FURTHER REFILLS 5 tablet 0   rosuvastatin (CRESTOR) 5 MG tablet Take 1 tablet (5 mg total) by mouth daily. 90 tablet 0   cyclobenzaprine (FLEXERIL) 5 MG tablet Take 1 tablet (5 mg total) by mouth at bedtime as needed. 30 tablet 0   Na Sulfate-K Sulfate-Mg Sulf 17.5-3.13-1.6 GM/177ML SOLN See admin instructions.     predniSONE (DELTASONE) 20 MG tablet Take 2 tablets daily with breakfast. 10 tablet 0   traMADol (ULTRAM) 50 MG tablet Take 1 tablet (50 mg total) by mouth every 6 (six) hours as needed. 15 tablet 0   No facility-administered medications prior to visit.   No Known Allergies Objective:   Today's Vitals   12/04/23 0901  BP: 134/86  Pulse: 75  TempSrc: Temporal  SpO2: 97%  Weight: 153 lb 6.4 oz (69.6 kg)  Height: 5' 6.5" (1.689 m)   Body mass index is 24.39 kg/m.   General: Well developed, well nourished. No acute distress. HEENT: Normocephalic, non-traumatic. PERRL, EOMI. Conjunctiva clear. External ears normal. EAC and TMs   normal bilaterally. Nose clear without congestion or rhinorrhea. Mucous membranes moist. Oropharynx clear.   Extensive dental caries and swelling of gums around eroded teeth.. Neck: Supple. No lymphadenopathy. No thyromegaly. Lungs: Clear to auscultation bilaterally. No wheezing, rales or rhonchi. CV: RRR without murmurs or rubs. Pulses 2+ bilaterally. Abdomen: Soft, non-tender. Bowel sounds positive, normal pitch and frequency. No hepatosplenomegaly. No   rebound or guarding. Extremities: Full ROM. No joint swelling or tenderness. No edema noted. Skin: Warm and dry. There areas areas of redness and scaliness  over the cheeks and the bridge of the nose.   There is a generalized coarsening of the skin of the face. There is a 3-4 mm brown plaque with an irregular   surface adjacent to the lashes of the lower left eyelid. There are irregular scaly red patches on the shins of both   lower legs. Psych: Alert and oriented. Normal mood and affect.  Health Maintenance Due  Topic Date Due   HIV Screening  Never done   Pneumococcal Vaccine 33-68 Years old (1 of 2 - PCV) Never done   Zoster Vaccines- Shingrix (1 of 2) Never done     Assessment & Plan:   Problem List Items Addressed This Visit       Cardiovascular and Mediastinum   Coronary atherosclerosis   Strongly advise smoking cessation. Continue lipid management.      Relevant Medications   rosuvastatin (CRESTOR) 5 MG tablet   losartan-hydrochlorothiazide (HYZAAR) 100-12.5 MG tablet   Essential hypertension   Blood pressure is mildly high today. This had been in better control at her last 2 visits. I will monitor for  now. Continue losartan-HCTZ (Hyzaar) 50-12.5 mg daily. I will check annual renal labs.      Relevant Medications   rosuvastatin (CRESTOR) 5 MG tablet   losartan-hydrochlorothiazide (HYZAAR) 100-12.5 MG tablet   Other Relevant Orders   Basic metabolic panel with GFR     Digestive   Tooth decay   Urged Ms. Goin to seek dental care. Her severe dental caries and gum disease can increase cardiovascular risk.        Musculoskeletal and Integument   Malar rash   The facial rash may represent rosacea. However, in light of her psoriasis, I would consider other possible rheumatic issues. I will check an ANA and CRP/ESR.      Relevant Orders   ANA   C-reactive protein   Sedimentation rate   Psoriasis   I will renew her Diprolene for management.      Relevant Medications   betamethasone dipropionate (DIPROLENE) 0.05 % ointment     Other   Hyperlipidemia   I will check lipids today. Continue rosuvastatin 5 mg  daily.       Relevant Medications   rosuvastatin (CRESTOR) 5 MG tablet   losartan-hydrochlorothiazide (HYZAAR) 100-12.5 MG tablet   Other Relevant Orders   Lipid panel   Subareolar mass of left breast   Recent mammogram suggests this is likely benign.      Tobacco use   I continue to strongly urge smoking cessation. We discussed how she might use a nicotine patch, starting with a 14 mg/day patch.  I spent 4 minutes counseling the patient about tobacco cessation.       Other Visit Diagnoses       Annual physical exam    -  Primary   Fair health overall. Recommend she start to include exercise in her daily routine. Discussed recommended screenings and immunizations.       Return in about 3 months (around 03/04/2024) for Reassessment.   Loyola Mast, MD

## 2023-12-04 NOTE — Assessment & Plan Note (Signed)
 Strongly advise smoking cessation. Continue lipid management.

## 2023-12-04 NOTE — Assessment & Plan Note (Signed)
 Recent mammogram suggests this is likely benign.

## 2023-12-04 NOTE — Assessment & Plan Note (Signed)
I will check lipids today. Continue rosuvastatin 5 mg daily.

## 2023-12-04 NOTE — Assessment & Plan Note (Signed)
 I will renew her Diprolene for management.

## 2023-12-06 LAB — ANA: Anti Nuclear Antibody (ANA): POSITIVE — AB

## 2023-12-06 LAB — ANTI-NUCLEAR AB-TITER (ANA TITER): ANA Titer 1: 1:80 {titer} — ABNORMAL HIGH

## 2023-12-07 ENCOUNTER — Encounter: Payer: Self-pay | Admitting: Family Medicine

## 2023-12-07 DIAGNOSIS — R768 Other specified abnormal immunological findings in serum: Secondary | ICD-10-CM | POA: Insufficient documentation

## 2024-03-03 ENCOUNTER — Other Ambulatory Visit: Payer: Self-pay | Admitting: Family Medicine

## 2024-03-03 DIAGNOSIS — Z1231 Encounter for screening mammogram for malignant neoplasm of breast: Secondary | ICD-10-CM

## 2024-03-06 IMAGING — DX DG FOOT COMPLETE 3+V*R*
3 series · 3 of 3 positions shown · non-contrast
Comparison: None.

CLINICAL DATA: Trauma, pain

EXAM:
RIGHT FOOT COMPLETE - 3+ VIEW

[foot ap]
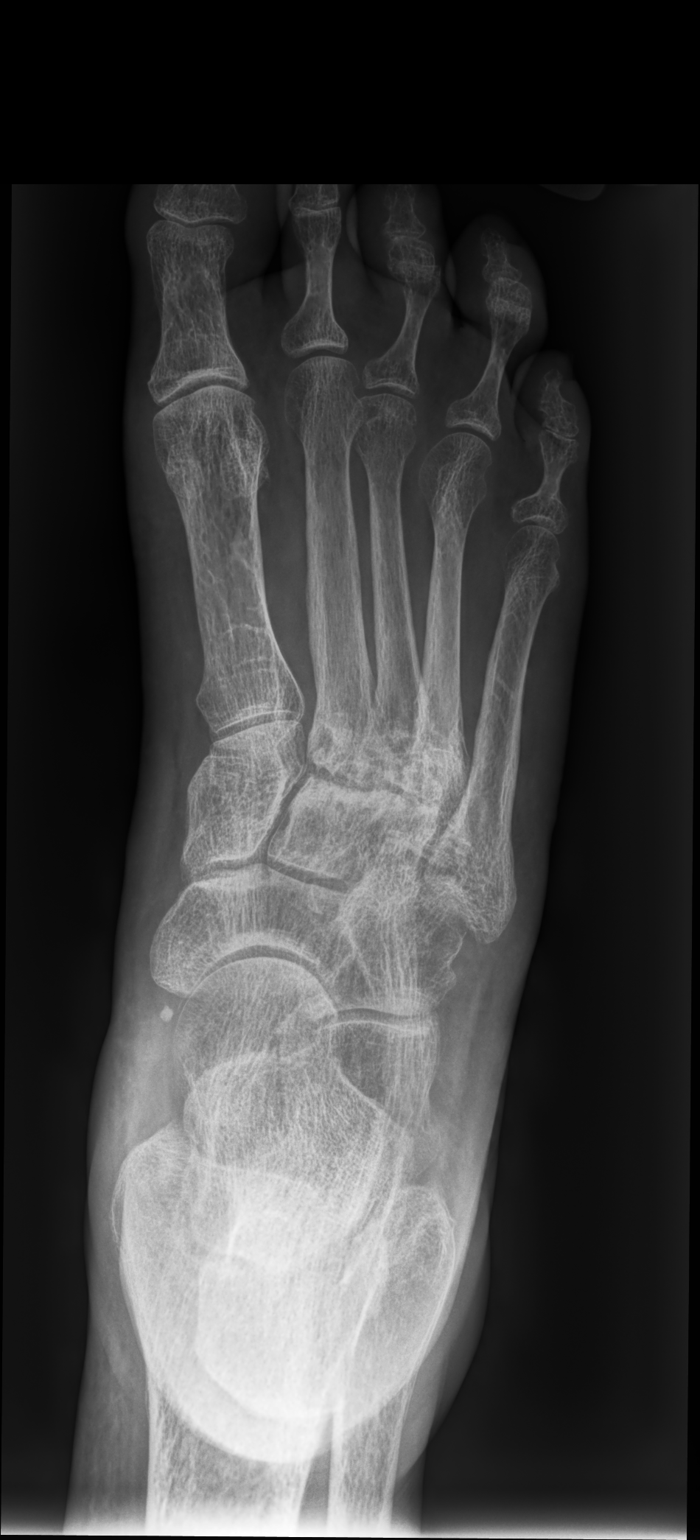

[foot mlo]
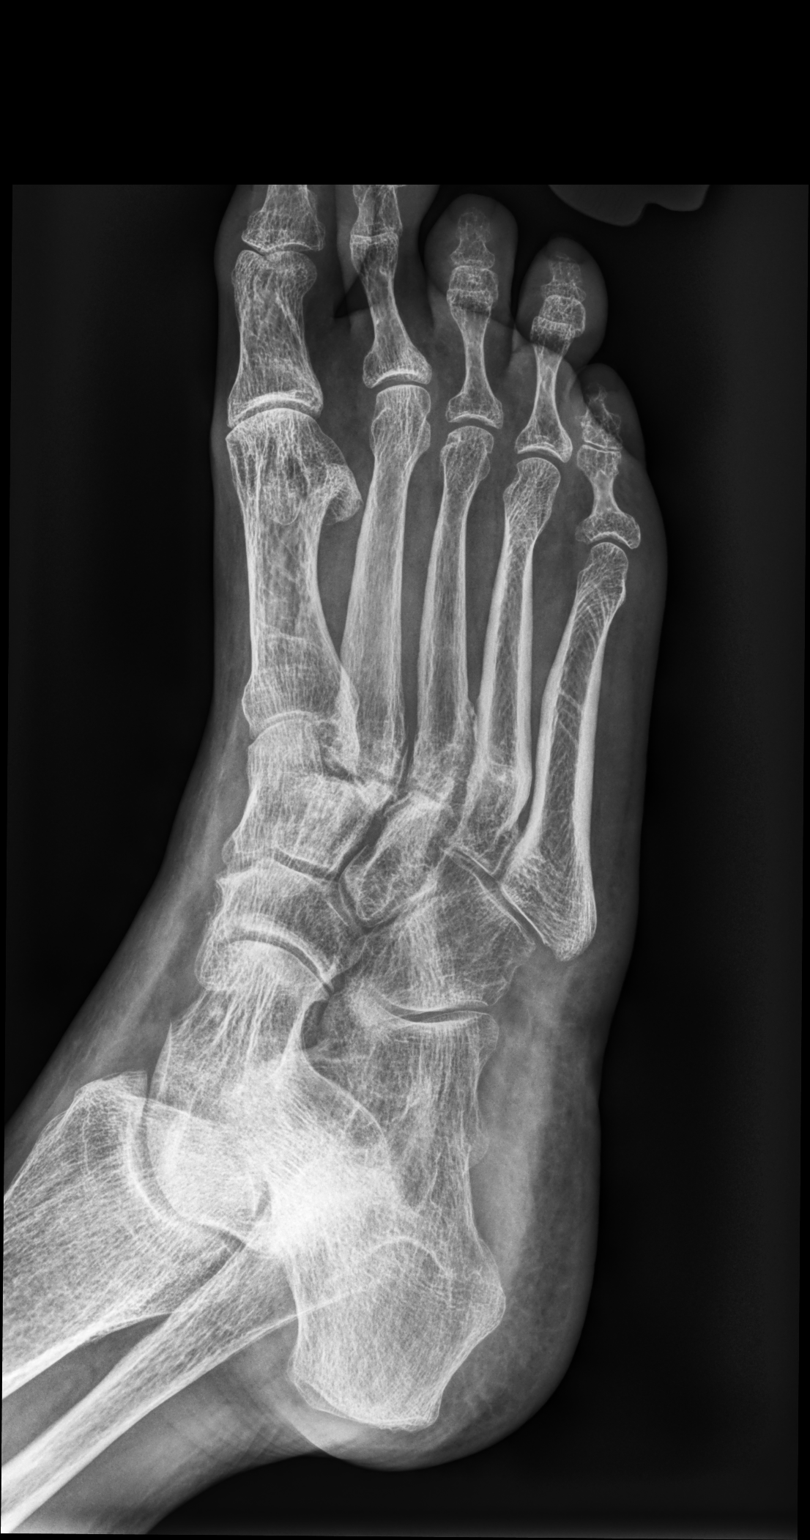

[foot lat]
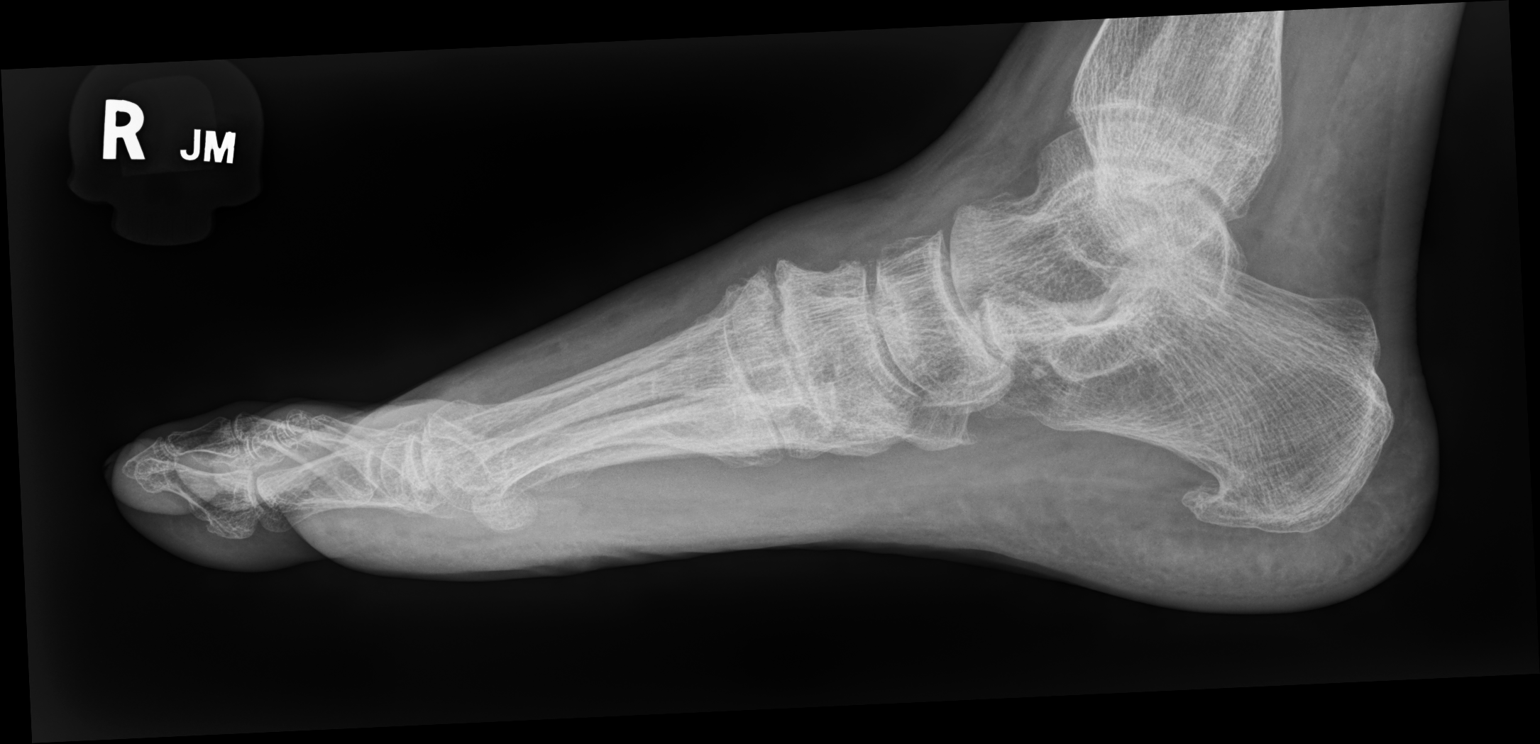

[3 of 3 positions shown; findings below may reference images not displayed]

FINDINGS: No recent fracture or dislocation is seen. Severe degenerative
changes are noted in the second and third tarsometatarsal joints.
Possibility of early Charcot joint disease is not excluded. Plantar
spur is seen in calcaneus. There is soft tissue swelling over the
dorsum.
IMPRESSION: No recent fracture or dislocation is seen. Severe degenerative
changes are noted in the second and third tarsometatarsal joints
which may be due to osteoarthritis or early changes of Charcot's
disease. Plantar spur is seen in the calcaneus.

## 2024-03-17 ENCOUNTER — Ambulatory Visit
Admission: RE | Admit: 2024-03-17 | Discharge: 2024-03-17 | Disposition: A | Discharge status: Home / Self Care | Source: Ambulatory Visit | Attending: Family Medicine

## 2024-03-17 DIAGNOSIS — Z1231 Encounter for screening mammogram for malignant neoplasm of breast: Secondary | ICD-10-CM

## 2024-05-02 ENCOUNTER — Encounter: Payer: Self-pay | Admitting: Family Medicine

## 2024-06-03 ENCOUNTER — Other Ambulatory Visit

## 2024-06-03 ENCOUNTER — Encounter

## 2024-06-10 ENCOUNTER — Encounter

## 2024-06-10 ENCOUNTER — Other Ambulatory Visit

## 2024-06-28 ENCOUNTER — Ambulatory Visit
Admission: RE | Admit: 2024-06-28 | Discharge: 2024-06-28 | Disposition: A | Discharge status: Home / Self Care | Source: Ambulatory Visit | Attending: Family Medicine | Admitting: Family Medicine

## 2024-06-28 DIAGNOSIS — N6325 Unspecified lump in the left breast, overlapping quadrants: Secondary | ICD-10-CM | POA: Diagnosis not present

## 2024-06-28 DIAGNOSIS — N6320 Unspecified lump in the left breast, unspecified quadrant: Secondary | ICD-10-CM

## 2024-06-28 DIAGNOSIS — R928 Other abnormal and inconclusive findings on diagnostic imaging of breast: Secondary | ICD-10-CM | POA: Diagnosis not present
